# Patient Record
Sex: Female | Born: 1990 | Race: White | Hispanic: No | Smoking: Current every day smoker
Health system: Southern US, Community
[De-identification: ages and names within clinical notes are randomized; demographics above are authoritative.]

## PROBLEM LIST (undated history)

## (undated) DIAGNOSIS — R569 Unspecified convulsions: Secondary | ICD-10-CM

---
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
## 2014-01-08 ENCOUNTER — Emergency Department: Payer: Self-pay | Admitting: Emergency Medicine

## 2014-01-14 ENCOUNTER — Emergency Department: Payer: Self-pay | Admitting: Emergency Medicine

## 2014-03-21 ENCOUNTER — Emergency Department: Payer: Self-pay | Admitting: Emergency Medicine

## 2014-03-28 ENCOUNTER — Emergency Department: Payer: Self-pay | Admitting: Emergency Medicine

## 2014-07-04 ENCOUNTER — Ambulatory Visit: Payer: Self-pay

## 2014-07-06 ENCOUNTER — Emergency Department: Payer: Self-pay | Admitting: Emergency Medicine

## 2014-07-06 LAB — URINALYSIS, COMPLETE
BILIRUBIN, UR: NEGATIVE
Glucose,UR: NEGATIVE mg/dL (ref 0–75)
Hyaline Cast: 3
Ketone: NEGATIVE
NITRITE: NEGATIVE
PH: 8 (ref 4.5–8.0)
Protein: NEGATIVE
RBC,UR: 8 /HPF (ref 0–5)
SPECIFIC GRAVITY: 1.006 (ref 1.003–1.030)
WBC UR: 60 /HPF (ref 0–5)

## 2014-07-08 ENCOUNTER — Emergency Department: Payer: Self-pay | Admitting: Emergency Medicine

## 2014-07-08 LAB — URINALYSIS, COMPLETE
BLOOD: NEGATIVE
Bilirubin,UR: NEGATIVE
Glucose,UR: NEGATIVE mg/dL (ref 0–75)
Nitrite: NEGATIVE
PH: 5 (ref 4.5–8.0)
Protein: 30
Specific Gravity: 1.025 (ref 1.003–1.030)
WBC UR: 23 /HPF (ref 0–5)

## 2014-07-08 LAB — COMPREHENSIVE METABOLIC PANEL
ALK PHOS: 51 U/L
AST: 18 U/L (ref 15–37)
Albumin: 3.7 g/dL (ref 3.4–5.0)
Anion Gap: 7 (ref 7–16)
BILIRUBIN TOTAL: 0.5 mg/dL (ref 0.2–1.0)
BUN: 10 mg/dL (ref 7–18)
CREATININE: 1.02 mg/dL (ref 0.60–1.30)
Calcium, Total: 8.7 mg/dL (ref 8.5–10.1)
Chloride: 103 mmol/L (ref 98–107)
Co2: 24 mmol/L (ref 21–32)
EGFR (Non-African Amer.): >60
GLUCOSE: 135 mg/dL — AB (ref 65–99)
Osmolality: 269 (ref 275–301)
POTASSIUM: 3.6 mmol/L (ref 3.5–5.1)
SGPT (ALT): 20 U/L
Sodium: 134 mmol/L — ABNORMAL LOW (ref 136–145)
Total Protein: 7.4 g/dL (ref 6.4–8.2)

## 2014-07-08 LAB — CBC WITH DIFFERENTIAL/PLATELET
Basophil #: 0 10*3/uL (ref 0.0–0.1)
Basophil %: 0.2 %
Eosinophil #: 0 10*3/uL (ref 0.0–0.7)
Eosinophil %: 0.1 %
HCT: 38.3 % (ref 35.0–47.0)
HGB: 12.6 g/dL (ref 12.0–16.0)
LYMPHS PCT: 2.7 %
Lymphocyte #: 0.5 10*3/uL — ABNORMAL LOW (ref 1.0–3.6)
MCH: 29.9 pg (ref 26.0–34.0)
MCHC: 33 g/dL (ref 32.0–36.0)
MCV: 91 fL (ref 80–100)
MONOS PCT: 7.2 %
Monocyte #: 1.4 x10 3/mm — ABNORMAL HIGH (ref 0.2–0.9)
Neutrophil #: 17.7 10*3/uL — ABNORMAL HIGH (ref 1.4–6.5)
Neutrophil %: 89.8 %
Platelet: 196 10*3/uL (ref 150–440)
RBC: 4.22 10*6/uL (ref 3.80–5.20)
RDW: 12.1 % (ref 11.5–14.5)
WBC: 19.7 10*3/uL — ABNORMAL HIGH (ref 3.6–11.0)

## 2014-07-08 LAB — PREGNANCY, URINE: Pregnancy Test, Urine: NEGATIVE m[IU]/mL

## 2014-07-09 LAB — URINE CULTURE

## 2015-03-22 ENCOUNTER — Emergency Department
Admission: EM | Admit: 2015-03-22 | Discharge: 2015-03-22 | Disposition: A | Discharge status: Home / Self Care | Payer: Self-pay | Attending: Emergency Medicine | Admitting: Emergency Medicine

## 2015-03-22 ENCOUNTER — Emergency Department: Payer: Self-pay

## 2015-03-22 DIAGNOSIS — Z3202 Encounter for pregnancy test, result negative: Secondary | ICD-10-CM | POA: Insufficient documentation

## 2015-03-22 DIAGNOSIS — Z720 Tobacco use: Secondary | ICD-10-CM | POA: Insufficient documentation

## 2015-03-22 DIAGNOSIS — Y998 Other external cause status: Secondary | ICD-10-CM | POA: Insufficient documentation

## 2015-03-22 DIAGNOSIS — Y9289 Other specified places as the place of occurrence of the external cause: Secondary | ICD-10-CM | POA: Insufficient documentation

## 2015-03-22 DIAGNOSIS — Z880 Allergy status to penicillin: Secondary | ICD-10-CM | POA: Insufficient documentation

## 2015-03-22 DIAGNOSIS — S39012A Strain of muscle, fascia and tendon of lower back, initial encounter: Secondary | ICD-10-CM | POA: Insufficient documentation

## 2015-03-22 DIAGNOSIS — X58XXXA Exposure to other specified factors, initial encounter: Secondary | ICD-10-CM | POA: Insufficient documentation

## 2015-03-22 DIAGNOSIS — N390 Urinary tract infection, site not specified: Secondary | ICD-10-CM | POA: Insufficient documentation

## 2015-03-22 DIAGNOSIS — Y9389 Activity, other specified: Secondary | ICD-10-CM | POA: Insufficient documentation

## 2015-03-22 LAB — URINALYSIS COMPLETE WITH MICROSCOPIC (ARMC ONLY)
BILIRUBIN URINE: NEGATIVE
Glucose, UA: NEGATIVE mg/dL
Nitrite: NEGATIVE
PROTEIN: NEGATIVE mg/dL
Specific Gravity, Urine: 1.018 (ref 1.005–1.030)
pH: 5 (ref 5.0–8.0)

## 2015-03-22 LAB — POCT PREGNANCY, URINE: PREG TEST UR: NEGATIVE

## 2015-03-22 MED ORDER — KETOROLAC TROMETHAMINE 30 MG/ML IJ SOLN
INTRAMUSCULAR | Status: AC
  Administered 2015-03-22: 30 mg via INTRAMUSCULAR
  Filled 2015-03-22: qty 1

## 2015-03-22 MED ORDER — ORPHENADRINE CITRATE 30 MG/ML IJ SOLN
INTRAMUSCULAR | Status: AC
  Administered 2015-03-22: 30 mg via INTRAMUSCULAR
  Filled 2015-03-22: qty 2

## 2015-03-22 MED ORDER — KETOROLAC TROMETHAMINE 30 MG/ML IJ SOLN
30.0000 mg | Freq: Once | INTRAMUSCULAR | Status: AC
  Administered 2015-03-22: 30 mg via INTRAMUSCULAR

## 2015-03-22 MED ORDER — IBUPROFEN 200 MG PO TABS: 600.0000 mg | ORAL_TABLET | Freq: Three times a day (TID) | ORAL | Status: AC | PRN

## 2015-03-22 MED ORDER — ORPHENADRINE CITRATE 30 MG/ML IJ SOLN
30.0000 mg | Freq: Once | INTRAMUSCULAR | Status: AC
  Administered 2015-03-22: 30 mg via INTRAMUSCULAR

## 2015-03-22 MED ORDER — CYCLOBENZAPRINE HCL 5 MG PO TABS: 5.0000 mg | ORAL_TABLET | Freq: Three times a day (TID) | ORAL | Status: DC | PRN

## 2015-03-22 MED ORDER — NITROFURANTOIN MONOHYD MACRO 100 MG PO CAPS: 100.0000 mg | ORAL_CAPSULE | Freq: Two times a day (BID) | ORAL | Status: AC
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
## 2015-03-22 NOTE — Discharge Instructions (Signed)
Take medication as prescribed. Drink plenty of water. Rest. Stretch. Follow up with your primary care physician in 2-3 days. Return to ER for new or worsening concerns.  Low Back Sprain with Rehab  A sprain is an injury in which a ligament is torn. The ligaments of the lower back are vulnerable to sprains. However, they are strong and require great force to be injured. These ligaments are important for stabilizing the spinal column. Sprains are classified into three categories. Grade 1 sprains cause pain, but the tendon is not lengthened. Grade 2 sprains include a lengthened ligament, due to the ligament being stretched or partially ruptured. With grade 2 sprains there is still function, although the function may be decreased. Grade 3 sprains involve a complete tear of the tendon or muscle, and function is usually impaired. SYMPTOMS   Severe pain in the lower back.  Sometimes, a feeling of a "pop," "snap," or tear, at the time of injury.  Tenderness and sometimes swelling at the injury site.  Uncommonly, bruising (contusion) within 48 hours of injury.  Muscle spasms in the back. CAUSES  Low back sprains occur when a force is placed on the ligaments that is greater than they can handle. Common causes of injury include:  Performing a stressful act while off-balance.  Repetitive stressful activities that involve movement of the lower back.  Direct hit (trauma) to the lower back. RISK INCREASES WITH:  Contact sports (football, wrestling).  Collisions (major skiing accidents).  Sports that require throwing or lifting (baseball, weightlifting).  Sports involving twisting of the spine (gymnastics, diving, tennis, golf).  Poor strength and flexibility.  Inadequate protection.  Previous back injury or surgery (especially fusion). PREVENTION  Wear properly fitted and padded protective equipment.  Warm up and stretch properly before activity.  Allow for adequate recovery between  workouts.  Maintain physical fitness:  Strength, flexibility, and endurance.  Cardiovascular fitness.  Maintain a healthy body weight. PROGNOSIS  If treated properly, low back sprains usually heal with non-surgical treatment. The length of time for healing depends on the severity of the injury.  RELATED COMPLICATIONS   Recurring symptoms, resulting in a chronic problem.  Chronic inflammation and pain in the low back.  Delayed healing or resolution of symptoms, especially if activity is resumed too soon.  Prolonged impairment.  Unstable or arthritic joints of the low back. TREATMENT  Treatment first involves the use of ice and medicine, to reduce pain and inflammation. The use of strengthening and stretching exercises may help reduce pain with activity. These exercises may be performed at home or with a therapist. Severe injuries may require referral to a therapist for further evaluation and treatment, such as ultrasound. Your caregiver may advise that you wear a back brace or corset, to help reduce pain and discomfort. Often, prolonged bed rest results in greater harm then benefit. Corticosteroid injections may be recommended. However, these should be reserved for the most serious cases. It is important to avoid using your back when lifting objects. At night, sleep on your back on a firm mattress, with a pillow placed under your knees. If non-surgical treatment is unsuccessful, surgery may be needed.  MEDICATION   If pain medicine is needed, nonsteroidal anti-inflammatory medicines (aspirin and ibuprofen), or other minor pain relievers (acetaminophen), are often advised.  Do not take pain medicine for 7 days before surgery.  Prescription pain relievers may be given, if your caregiver thinks they are needed. Use only as directed and only as much as you  need.  Ointments applied to the skin may be helpful.  Corticosteroid injections may be given by your caregiver. These injections  should be reserved for the most serious cases, because they may only be given a certain number of times. HEAT AND COLD  Cold treatment (icing) should be applied for 10 to 15 minutes every 2 to 3 hours for inflammation and pain, and immediately after activity that aggravates your symptoms. Use ice packs or an ice massage.  Heat treatment may be used before performing stretching and strengthening activities prescribed by your caregiver, physical therapist, or athletic trainer. Use a heat pack or a warm water soak. SEEK MEDICAL CARE IF:   Symptoms get worse or do not improve in 2 to 4 weeks, despite treatment.  You develop numbness or weakness in either leg.  You lose bowel or bladder function.  Any of the following occur after surgery: fever, increased pain, swelling, redness, drainage of fluids, or bleeding in the affected area.  New, unexplained symptoms develop. (Drugs used in treatment may produce side effects.) EXERCISES  RANGE OF MOTION (ROM) AND STRETCHING EXERCISES - Low Back Sprain Most people with lower back pain will find that their symptoms get worse with excessive bending forward (flexion) or arching at the lower back (extension). The exercises that will help resolve your symptoms will focus on the opposite motion.  Your physician, physical therapist or athletic trainer will help you determine which exercises will be most helpful to resolve your lower back pain. Do not complete any exercises without first consulting with your caregiver. Discontinue any exercises which make your symptoms worse, until you speak to your caregiver. If you have pain, numbness or tingling which travels down into your buttocks, leg or foot, the goal of the therapy is for these symptoms to move closer to your back and eventually resolve. Sometimes, these leg symptoms will get better, but your lower back pain may worsen. This is often an indication of progress in your rehabilitation. Be very alert to any  changes in your symptoms and the activities in which you participated in the 24 hours prior to the change. Sharing this information with your caregiver will allow him or her to most efficiently treat your condition. These exercises may help you when beginning to rehabilitate your injury. Your symptoms may resolve with or without further involvement from your physician, physical therapist or athletic trainer. While completing these exercises, remember:   Restoring tissue flexibility helps normal motion to return to the joints. This allows healthier, less painful movement and activity.  An effective stretch should be held for at least 30 seconds.  A stretch should never be painful. You should only feel a gentle lengthening or release in the stretched tissue. FLEXION RANGE OF MOTION AND STRETCHING EXERCISES: STRETCH - Flexion, Single Knee to Chest   Lie on a firm bed or floor with both legs extended in front of you.  Keeping one leg in contact with the floor, bring your opposite knee to your chest. Hold your leg in place by either grabbing behind your thigh or at your knee.  Pull until you feel a gentle stretch in your low back. Hold __________ seconds.  Slowly release your grasp and repeat the exercise with the opposite side. Repeat __________ times. Complete this exercise __________ times per day.  STRETCH - Flexion, Double Knee to Chest  Lie on a firm bed or floor with both legs extended in front of you.  Keeping one leg in contact with the  floor, bring your opposite knee to your chest.  Tense your stomach muscles to support your back and then lift your other knee to your chest. Hold your legs in place by either grabbing behind your thighs or at your knees.  Pull both knees toward your chest until you feel a gentle stretch in your low back. Hold __________ seconds.  Tense your stomach muscles and slowly return one leg at a time to the floor. Repeat __________ times. Complete this  exercise __________ times per day.  STRETCH - Low Trunk Rotation  Lie on a firm bed or floor. Keeping your legs in front of you, bend your knees so they are both pointed toward the ceiling and your feet are flat on the floor.  Extend your arms out to the side. This will stabilize your upper body by keeping your shoulders in contact with the floor.  Gently and slowly drop both knees together to one side until you feel a gentle stretch in your low back. Hold for __________ seconds.  Tense your stomach muscles to support your lower back as you bring your knees back to the starting position. Repeat the exercise to the other side. Repeat __________ times. Complete this exercise __________ times per day  EXTENSION RANGE OF MOTION AND FLEXIBILITY EXERCISES: STRETCH - Extension, Prone on Elbows   Lie on your stomach on the floor, a bed will be too soft. Place your palms about shoulder width apart and at the height of your head.  Place your elbows under your shoulders. If this is too painful, stack pillows under your chest.  Allow your body to relax so that your hips drop lower and make contact more completely with the floor.  Hold this position for __________ seconds.  Slowly return to lying flat on the floor. Repeat __________ times. Complete this exercise __________ times per day.  RANGE OF MOTION - Extension, Prone Press Ups  Lie on your stomach on the floor, a bed will be too soft. Place your palms about shoulder width apart and at the height of your head.  Keeping your back as relaxed as possible, slowly straighten your elbows while keeping your hips on the floor. You may adjust the placement of your hands to maximize your comfort. As you gain motion, your hands will come more underneath your shoulders.  Hold this position __________ seconds.  Slowly return to lying flat on the floor. Repeat __________ times. Complete this exercise __________ times per day.  RANGE OF MOTION- Quadruped,  Neutral Spine   Assume a hands and knees position on a firm surface. Keep your hands under your shoulders and your knees under your hips. You may place padding under your knees for comfort.  Drop your head and point your tailbone toward the ground below you. This will round out your lower back like an angry cat. Hold this position for __________ seconds.  Slowly lift your head and release your tail bone so that your back sags into a large arch, like an old horse.  Hold this position for __________ seconds.  Repeat this until you feel limber in your low back.  Now, find your "sweet spot." This will be the most comfortable position somewhere between the two previous positions. This is your neutral spine. Once you have found this position, tense your stomach muscles to support your low back.  Hold this position for __________ seconds. Repeat __________ times. Complete this exercise __________ times per day.  STRENGTHENING EXERCISES - Low Back Sprain These  exercises may help you when beginning to rehabilitate your injury. These exercises should be done near your "sweet spot." This is the neutral, low-back arch, somewhere between fully rounded and fully arched, that is your least painful position. When performed in this safe range of motion, these exercises can be used for people who have either a flexion or extension based injury. These exercises may resolve your symptoms with or without further involvement from your physician, physical therapist or athletic trainer. While completing these exercises, remember:   Muscles can gain both the endurance and the strength needed for everyday activities through controlled exercises.  Complete these exercises as instructed by your physician, physical therapist or athletic trainer. Increase the resistance and repetitions only as guided.  You may experience muscle soreness or fatigue, but the pain or discomfort you are trying to eliminate should never worsen  during these exercises. If this pain does worsen, stop and make certain you are following the directions exactly. If the pain is still present after adjustments, discontinue the exercise until you can discuss the trouble with your caregiver. STRENGTHENING - Deep Abdominals, Pelvic Tilt   Lie on a firm bed or floor. Keeping your legs in front of you, bend your knees so they are both pointed toward the ceiling and your feet are flat on the floor.  Tense your lower abdominal muscles to press your low back into the floor. This motion will rotate your pelvis so that your tail bone is scooping upwards rather than pointing at your feet or into the floor. With a gentle tension and even breathing, hold this position for __________ seconds. Repeat __________ times. Complete this exercise __________ times per day.  STRENGTHENING - Abdominals, Crunches   Lie on a firm bed or floor. Keeping your legs in front of you, bend your knees so they are both pointed toward the ceiling and your feet are flat on the floor. Cross your arms over your chest.  Slightly tip your chin down without bending your neck.  Tense your abdominals and slowly lift your trunk high enough to just clear your shoulder blades. Lifting higher can put excessive stress on the lower back and does not further strengthen your abdominal muscles.  Control your return to the starting position. Repeat __________ times. Complete this exercise __________ times per day.  STRENGTHENING - Quadruped, Opposite UE/LE Lift   Assume a hands and knees position on a firm surface. Keep your hands under your shoulders and your knees under your hips. You may place padding under your knees for comfort.  Find your neutral spine and gently tense your abdominal muscles so that you can maintain this position. Your shoulders and hips should form a rectangle that is parallel with the floor and is not twisted.  Keeping your trunk steady, lift your right hand no higher  than your shoulder and then your left leg no higher than your hip. Make sure you are not holding your breath. Hold this position for __________ seconds.  Continuing to keep your abdominal muscles tense and your back steady, slowly return to your starting position. Repeat with the opposite arm and leg. Repeat __________ times. Complete this exercise __________ times per day.  STRENGTHENING - Abdominals and Quadriceps, Straight Leg Raise   Lie on a firm bed or floor with both legs extended in front of you.  Keeping one leg in contact with the floor, bend the other knee so that your foot can rest flat on the floor.  Find your neutral  spine, and tense your abdominal muscles to maintain your spinal position throughout the exercise.  Slowly lift your straight leg off the floor about 6 inches for a count of 15, making sure to not hold your breath.  Still keeping your neutral spine, slowly lower your leg all the way to the floor. Repeat this exercise with each leg __________ times. Complete this exercise __________ times per day. POSTURE AND BODY MECHANICS CONSIDERATIONS - Low Back Sprain Keeping correct posture when sitting, standing or completing your activities will reduce the stress put on different body tissues, allowing injured tissues a chance to heal and limiting painful experiences. The following are general guidelines for improved posture. Your physician or physical therapist will provide you with any instructions specific to your needs. While reading these guidelines, remember:  The exercises prescribed by your provider will help you have the flexibility and strength to maintain correct postures.  The correct posture provides the best environment for your joints to work. All of your joints have less wear and tear when properly supported by a spine with good posture. This means you will experience a healthier, less painful body.  Correct posture must be practiced with all of your activities,  especially prolonged sitting and standing. Correct posture is as important when doing repetitive low-stress activities (typing) as it is when doing a single heavy-load activity (lifting). RESTING POSITIONS Consider which positions are most painful for you when choosing a resting position. If you have pain with flexion-based activities (sitting, bending, stooping, squatting), choose a position that allows you to rest in a less flexed posture. You would want to avoid curling into a fetal position on your side. If your pain worsens with extension-based activities (prolonged standing, working overhead), avoid resting in an extended position such as sleeping on your stomach. Most people will find more comfort when they rest with their spine in a more neutral position, neither too rounded nor too arched. Lying on a non-sagging bed on your side with a pillow between your knees, or on your back with a pillow under your knees will often provide some relief. Keep in mind, being in any one position for a prolonged period of time, no matter how correct your posture, can still lead to stiffness. PROPER SITTING POSTURE In order to minimize stress and discomfort on your spine, you must sit with correct posture. Sitting with good posture should be effortless for a healthy body. Returning to good posture is a gradual process. Many people can work toward this most comfortably by using various supports until they have the flexibility and strength to maintain this posture on their own. When sitting with proper posture, your ears will fall over your shoulders and your shoulders will fall over your hips. You should use the back of the chair to support your upper back. Your lower back will be in a neutral position, just slightly arched. You may place a small pillow or folded towel at the base of your lower back for  support.  When working at a desk, create an environment that supports good, upright posture. Without extra support,  muscles tire, which leads to excessive strain on joints and other tissues. Keep these recommendations in mind: CHAIR:  A chair should be able to slide under your desk when your back makes contact with the back of the chair. This allows you to work closely.  The chair's height should allow your eyes to be level with the upper part of your monitor and your hands to  be slightly lower than your elbows. BODY POSITION  Your feet should make contact with the floor. If this is not possible, use a foot rest.  Keep your ears over your shoulders. This will reduce stress on your neck and low back. INCORRECT SITTING POSTURES  If you are feeling tired and unable to assume a healthy sitting posture, do not slouch or slump. This puts excessive strain on your back tissues, causing more damage and pain. Healthier options include:  Using more support, like a lumbar pillow.  Switching tasks to something that requires you to be upright or walking.  Talking a brief walk.  Lying down to rest in a neutral-spine position. PROLONGED STANDING WHILE SLIGHTLY LEANING FORWARD  When completing a task that requires you to lean forward while standing in one place for a long time, place either foot up on a stationary 2-4 inch high object to help maintain the best posture. When both feet are on the ground, the lower back tends to lose its slight inward curve. If this curve flattens (or becomes too large), then the back and your other joints will experience too much stress, tire more quickly, and can cause pain. CORRECT STANDING POSTURES Proper standing posture should be assumed with all daily activities, even if they only take a few moments, like when brushing your teeth. As in sitting, your ears should fall over your shoulders and your shoulders should fall over your hips. You should keep a slight tension in your abdominal muscles to brace your spine. Your tailbone should point down to the ground, not behind your body,  resulting in an over-extended swayback posture.  INCORRECT STANDING POSTURES  Common incorrect standing postures include a forward head, locked knees and/or an excessive swayback. WALKING Walk with an upright posture. Your ears, shoulders and hips should all line-up. PROLONGED ACTIVITY IN A FLEXED POSITION When completing a task that requires you to bend forward at your waist or lean over a low surface, try to find a way to stabilize 3 out of 4 of your limbs. You can place a hand or elbow on your thigh or rest a knee on the surface you are reaching across. This will provide you more stability, so that your muscles do not tire as quickly. By keeping your knees relaxed, or slightly bent, you will also reduce stress across your lower back. CORRECT LIFTING TECHNIQUES DO :  Assume a wide stance. This will provide you more stability and the opportunity to get as close as possible to the object which you are lifting.  Tense your abdominals to brace your spine. Bend at the knees and hips. Keeping your back locked in a neutral-spine position, lift using your leg muscles. Lift with your legs, keeping your back straight.  Test the weight of unknown objects before attempting to lift them.  Try to keep your elbows locked down at your sides in order get the best strength from your shoulders when carrying an object.  Always ask for help when lifting heavy or awkward objects. INCORRECT LIFTING TECHNIQUES DO NOT:   Lock your knees when lifting, even if it is a small object.  Bend and twist. Pivot at your feet or move your feet when needing to change directions.  Assume that you can safely pick up even a paperclip without proper posture. Document Released: 11/04/2005 Document Revised: 01/27/2012 Document Reviewed: 02/16/2009 [facility] Patient Information 2015 [place], [place]. This information is not intended to replace advice given to you by your health care provider.  Make sure you discuss any questions you  have with your health care provider.  Urinary Tract Infection Urinary tract infections (UTIs) can develop anywhere along your urinary tract. Your urinary tract is your body's drainage system for removing wastes and extra water. Your urinary tract includes two kidneys, two ureters, a bladder, and a urethra. Your kidneys are a pair of bean-shaped organs. Each kidney is about the size of your fist. They are located below your ribs, one on each side of your spine. CAUSES Infections are caused by microbes, which are microscopic organisms, including fungi, viruses, and bacteria. These organisms are so small that they can only be seen through a microscope. Bacteria are the microbes that most commonly cause UTIs. SYMPTOMS  Symptoms of UTIs may vary by age and gender of the patient and by the location of the infection. Symptoms in young women typically include a frequent and intense urge to urinate and a painful, burning feeling in the bladder or urethra during urination. Older women and men are more likely to be tired, shaky, and weak and have muscle aches and abdominal pain. A fever may mean the infection is in your kidneys. Other symptoms of a kidney infection include pain in your back or sides below the ribs, nausea, and vomiting. DIAGNOSIS To diagnose a UTI, your caregiver will ask you about your symptoms. Your caregiver also will ask to provide a urine sample. The urine sample will be tested for bacteria and white blood cells. White blood cells are made by your body to help fight infection. TREATMENT  Typically, UTIs can be treated with medication. Because most UTIs are caused by a bacterial infection, they usually can be treated with the use of antibiotics. The choice of antibiotic and length of treatment depend on your symptoms and the type of bacteria causing your infection. HOME CARE INSTRUCTIONS  If you were prescribed antibiotics, take them exactly as your caregiver instructs you. Finish the  medication even if you feel better after you have only taken some of the medication.  Drink enough water and fluids to keep your urine clear or pale yellow.  Avoid caffeine, tea, and carbonated beverages. They tend to irritate your bladder.  Empty your bladder often. Avoid holding urine for long periods of time.  Empty your bladder before and after sexual intercourse.  After a bowel movement, women should cleanse from front to back. Use each tissue only once. SEEK MEDICAL CARE IF:   You have back pain.  You develop a fever.  Your symptoms do not begin to resolve within 3 days. SEEK IMMEDIATE MEDICAL CARE IF:   You have severe back pain or lower abdominal pain.  You develop chills.  You have nausea or vomiting.  You have continued burning or discomfort with urination. MAKE SURE YOU:   Understand these instructions.  Will watch your condition.  Will get help right away if you are not doing well or get worse. Document Released: 08/14/2005 Document Revised: 05/05/2012 Document Reviewed: 12/13/2011 [facility] Patient Information 2015 [place], [place]. This information is not intended to replace advice given to you by your health care provider. Make sure you discuss any questions you have with your health care provider.

## 2015-03-22 NOTE — ED Notes (Signed)
This RN called to lab to obtain update on urine sample. Lab tech reported urine is still running at this time.

## 2015-03-25 LAB — URINE CULTURE
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
## 2015-07-06 ENCOUNTER — Encounter: Payer: Self-pay | Admitting: Emergency Medicine

## 2015-07-06 ENCOUNTER — Ambulatory Visit
Admission: EM | Admit: 2015-07-06 | Discharge: 2015-07-06 | Disposition: A | Discharge status: Home / Self Care | Payer: Self-pay | Attending: Emergency Medicine | Admitting: Emergency Medicine

## 2015-07-06 DIAGNOSIS — R059 Cough, unspecified: Secondary | ICD-10-CM

## 2015-07-06 DIAGNOSIS — F17210 Nicotine dependence, cigarettes, uncomplicated: Secondary | ICD-10-CM | POA: Insufficient documentation

## 2015-07-06 DIAGNOSIS — J0110 Acute frontal sinusitis, unspecified: Secondary | ICD-10-CM

## 2015-07-06 DIAGNOSIS — J019 Acute sinusitis, unspecified: Secondary | ICD-10-CM | POA: Insufficient documentation

## 2015-07-06 DIAGNOSIS — R05 Cough: Secondary | ICD-10-CM | POA: Insufficient documentation

## 2015-07-06 LAB — RAPID STREP SCREEN (MED CTR MEBANE ONLY): STREPTOCOCCUS, GROUP A SCREEN (DIRECT): NEGATIVE

## 2015-07-06 MED ORDER — AZITHROMYCIN 250 MG PO TABS: 250.0000 mg | ORAL_TABLET | Freq: Every day | ORAL | Status: DC

## 2015-07-06 MED ORDER — BENZONATATE 100 MG PO CAPS: 100.0000 mg | ORAL_CAPSULE | Freq: Three times a day (TID) | ORAL | Status: DC | PRN

## 2015-07-06 MED ORDER — IPRATROPIUM-ALBUTEROL 0.5-2.5 (3) MG/3ML IN SOLN
3.0000 mL | Freq: Once | RESPIRATORY_TRACT | Status: AC
  Administered 2015-07-06: 3 mL via RESPIRATORY_TRACT

## 2015-07-06 NOTE — ED Notes (Signed)
Pt with acough and head congestion x 2 weeks

## 2015-07-06 NOTE — ED Provider Notes (Signed)
CSN: [id number]     Arrival date & time 07/06/15  1346 History   First MD Initiated Contact with Patient 07/06/15 1427     Chief Complaint  Patient presents with  . Cough   (Consider location/radiation/quality/duration/timing/severity/associated sxs/prior Treatment) HPI  24 yo F reports 2 week hx of fever, cough, sinus congestion, sore throat , decreased appetite. Last week facial pain and tooth pain ,now improved. Denies previous diagnosis of seasonal allergies but symptoms prior to the acute are compatible with same- Does not own a thermometer but is sure she has had intermittent fever Has tried multiple OTC medications- various mixtures .Continues to smoke. Continues to cough. Has eustachian tube dysfunction , non-productive cough History reviewed. No pertinent past medical history. History reviewed. No pertinent past surgical history. History reviewed. No pertinent family history. Social History  Substance Use Topics  . Smoking status: Current Every Day Smoker -- 0.50 packs/day    Types: Cigarettes  . Smokeless tobacco: None  . Alcohol Use: None   OB History    No data available     Review of Systems Constitutional -afebrile Eyes-denies visual changes ENT- normal voice,positive sore throat CV-denies chest pain Resp-denies SOB, has frequent cough GI- negative for nausea,vomiting ( x 1 w cough), diarrhea GU- negative for dysuria MSK- negative for back pain, ambulatory Skin- denies acute changes Neuro- negative headache,focal weakness or numbness     Allergies  Penicillins  Home Medications   Prior to Admission medications   Medication Sig Start Date End Date Taking? Authorizing Provider  azithromycin (ZITHROMAX) 250 MG tablet Take 1 tablet (250 mg total) by mouth daily. Take first 2 tablets together, then 1 every day until finished. 07/06/15   Rae Halsted, PA-C  benzonatate (TESSALON) 100 MG capsule Take 1 capsule (100 mg total) by mouth 3 (three) times daily as  needed. 07/06/15   Rae Halsted, PA-C  cyclobenzaprine (FLEXERIL) 5 MG tablet Take 1 tablet (5 mg total) by mouth every 8 (eight) hours as needed for muscle spasms (or pain. Do not drive or operate machinery while taking as can cause drowsiness.). 03/22/15   Renford Dills, NP  ibuprofen (MOTRIN IB) 200 MG tablet Take 3 tablets (600 mg total) by mouth every 8 (eight) hours as needed for mild pain or moderate pain. 03/22/15 03/21/16  Renford Dills, NP   BP 103/67 mmHg  Pulse 85  Temp(Src) 98.5 F (36.9 C) (Tympanic)  Resp 18  Ht  (1.676 m)  Wt 110 lb (49.896 kg)  BMI 17.76 kg/m2  SpO2 100%  LMP 06/07/2015 Physical Exam  Constitutional -alert and oriented , afebrile Head-atraumatic, normocephalic, percussive pain over frontal and maxillary sinuses Eyes- conjunctiva normal, EOMI ,conjugate gaze Ears- canals and TM neg Nose- no congestion or rhinorrhea Mouth/throat- mucous membranes moist ,oropharynx erythematous, strep neg Neck- supple without glandular enlargement CV- regular rate, grossly normal heart sounds,  Resp-no distress, normal respiratory effort,clear to auscultation bilaterally, repeated cough,non-productive GI- soft,non-tender,no distention GU-deferred MSK- no tender, normal ROM, all extremities, ambulatory, self-care Neuro- normal speech and language, no gross focal neurological deficit appreciated, no gait instability, Skin-warm,dry ,intact; no rash noted Psych-mood and affect grossly normal; speech and behavior grossly normal ED Course  Procedures (including critical care time) Labs Review Labs Reviewed  RAPID STREP SCREEN (NOT AT [facility])  CULTURE, GROUP A STREP ([facility] ONLY)   Results for orders placed or performed during the hospital encounter of 07/06/15  Rapid strep screen  Result Value Ref Range   Streptococcus,  Group A Screen (Direct) NEGATIVE NEGATIVE  Culture, group A strep ([facility] only)  Result Value Ref Range   Specimen Description THROAT    Special Requests  NONE    Culture NO BETA STREPTOCOCCUS ISOLATED IN 12 HOURS    Report Status PENDING    Imaging Review No results found.  Medications  ipratropium-albuterol (DUONEB) 0.5-2.5 (3) MG/3ML nebulizer solution 3 mL (3 mLs Nebulization Given 07/06/15 1445)  well tolerated - chest loosened  Now remembers that she has an inhaler at home . Recently left her mothers home and living with girlfriend. Never realized she could use inhaler for her cough MDM   Cough Acute sinusitis  Plan: 1.  Diagnosis reviewed with patient-seasonal allergies suspected as influential in acute sinusitis 2. Rx Benzonatate, fluticasone ; risks, benefits, potential side effects reviewed with patient 3. Recommend supportive treatment with OTC ceterizine/guafenesin DM/tylenol/ibuprofen/fluids 4. F/u prn if symptoms worsen or don't improve; viral syndrome can be similar 5. OOW today 6. Strongly encouraged to establish PCP care- her father died and she lost her insurance. Mother doesn't have any either, Encouraged [facility] and learn about [facility]   Discharge Medication List as of 07/06/2015  3:17 PM    START taking these medications   Details  azithromycin (ZITHROMAX) 250 MG tablet Take 1 tablet (250 mg total) by mouth daily. Take first 2 tablets together, then 1 every day until finished., Starting 07/06/2015, Until Discontinued, Print    benzonatate (TESSALON) 100 MG capsule Take 1 capsule (100 mg total) by mouth 3 (three) times daily as needed., Starting 07/06/2015, Until Discontinued, Print         Rae Halsted, PA-C 07/07/15 2329

## 2015-07-06 NOTE — Discharge Instructions (Signed)
Azithromycin 2 tablets  today then 1 a day for 4 more days  Benzonatate  Cough pill 1 -2 every 6 hours as needed for cough  Robitussin DM ( guaifenasen DM) 1 tsp every 4 hours  Fluticasone nasal spray ( Flonase)  1 spray per nostril per day ( first 2-3 days may do twice a day)  Claritin 10 mg ( Loratadine ) 1 tablet daily  Until hard freeze ( ok twice daily for a few days)  Try NOT to smoke !  Continue allergy meds _ Claritin/Loratadine  And the fluticasone / Flonase daily until hard freeze  LED thermometer "FAST" and audible

## 2015-07-09 LAB — CULTURE, GROUP A STREP (THRC)

## 2016-06-05 ENCOUNTER — Encounter: Payer: Self-pay | Admitting: Emergency Medicine

## 2016-06-05 DIAGNOSIS — R55 Syncope and collapse: Secondary | ICD-10-CM | POA: Insufficient documentation

## 2016-06-05 DIAGNOSIS — L918 Other hypertrophic disorders of the skin: Secondary | ICD-10-CM | POA: Insufficient documentation

## 2016-06-05 DIAGNOSIS — F17210 Nicotine dependence, cigarettes, uncomplicated: Secondary | ICD-10-CM | POA: Insufficient documentation

## 2016-06-05 LAB — URINALYSIS COMPLETE WITH MICROSCOPIC (ARMC ONLY)
BILIRUBIN URINE: NEGATIVE
GLUCOSE, UA: NEGATIVE mg/dL
Nitrite: NEGATIVE
PH: 7 (ref 5.0–8.0)
Protein, ur: NEGATIVE mg/dL
Specific Gravity, Urine: 1.019 (ref 1.005–1.030)

## 2016-06-05 LAB — CBC
HCT: 42.5 % (ref 35.0–47.0)
Hemoglobin: 14.9 g/dL (ref 12.0–16.0)
MCH: 31 pg (ref 26.0–34.0)
MCHC: 35.1 g/dL (ref 32.0–36.0)
MCV: 88.1 fL (ref 80.0–100.0)
PLATELETS: 212 10*3/uL (ref 150–440)
RBC: 4.82 MIL/uL (ref 3.80–5.20)
RDW: 12.6 % (ref 11.5–14.5)
WBC: 7.7 10*3/uL (ref 3.6–11.0)

## 2016-06-05 LAB — BASIC METABOLIC PANEL
Anion gap: 10 (ref 5–15)
BUN: 15 mg/dL (ref 6–20)
CALCIUM: 9.6 mg/dL (ref 8.9–10.3)
CHLORIDE: 107 mmol/L (ref 101–111)
CO2: 23 mmol/L (ref 22–32)
CREATININE: 0.86 mg/dL (ref 0.44–1.00)
GFR calc Af Amer: >60 mL/min (ref >60)
GFR calc non Af Amer: >60 mL/min (ref >60)
Glucose, Bld: 122 mg/dL — ABNORMAL HIGH (ref 65–99)
Potassium: 3.1 mmol/L — ABNORMAL LOW (ref 3.5–5.1)
SODIUM: 140 mmol/L (ref 135–145)

## 2016-06-05 LAB — POCT PREGNANCY, URINE: Preg Test, Ur: NEGATIVE

## 2016-06-05 MED ORDER — ONDANSETRON 4 MG PO TBDP
ORAL_TABLET | ORAL | Status: AC
  Filled 2016-06-05: qty 1

## 2016-06-05 MED ORDER — ONDANSETRON 4 MG PO TBDP
4.0000 mg | ORAL_TABLET | Freq: Once | ORAL | Status: AC | PRN
  Administered 2016-06-05: 4 mg via ORAL

## 2016-06-05 NOTE — ED Notes (Addendum)
Pt presents to ED with c/o near syncopal episode, pt states last night she noticed mole on her lower back with bleeding. Pt states "I was siting on the bench and I turned really quick and it began to bleed." Pt reports then began to feel dizzy and nauseous. Pt alert and oriented x 4, no increased work in breathing noted. No bleeding noticed.

## 2016-06-06 ENCOUNTER — Emergency Department
Admission: EM | Admit: 2016-06-06 | Discharge: 2016-06-06 | Disposition: A | Discharge status: Home / Self Care | Payer: Self-pay | Attending: Emergency Medicine | Admitting: Emergency Medicine

## 2016-06-06 DIAGNOSIS — R55 Syncope and collapse: Secondary | ICD-10-CM

## 2016-06-06 DIAGNOSIS — L918 Other hypertrophic disorders of the skin: Secondary | ICD-10-CM

## 2016-06-06 MED ORDER — IBUPROFEN 400 MG PO TABS
400.0000 mg | ORAL_TABLET | Freq: Once | ORAL | Status: AC
  Administered 2016-06-06: 400 mg via ORAL
  Filled 2016-06-06: qty 1

## 2016-06-06 NOTE — ED Notes (Signed)
Pt left without being discharged or discharge paperwork being reviewed

## 2016-06-06 NOTE — ED Provider Notes (Signed)
[facility] Emergency Department Provider Note   ____________________________________________  Time seen: Approximately 12:56 AM  I have reviewed the triage vital signs and the nursing notes.   HISTORY  Chief Complaint Near Syncope   HPI Patricia Guerra is a 25 y.o. female without any chronic medical conditions was presenting to the emergency department today for near-syncopal episode. She said that she was sitting on a bench and turned her body and almost ripped off a mole on her back. She says the mole is been there for 6 months but is recently started to become a nuisance because she has had several bleeding episodes from the mole lately. She says that the pain was so excruciating when she injured the mole today that her vision went black and she lost hearing for several seconds. She denies any chest pain or shortness of breath. Says that she felt panicked but has returned to normal since this event. Says that she was having back pain around the mole that was injured and this is persistent.   History reviewed. No pertinent past medical history.  There are no active problems to display for this patient.   History reviewed. No pertinent past surgical history.  Current Outpatient Rx  Name  Route  Sig  Dispense  Refill  . azithromycin (ZITHROMAX) 250 MG tablet   Oral   Take 1 tablet (250 mg total) by mouth daily. Take first 2 tablets together, then 1 every day until finished. Patient not taking: Reported on 06/05/2016   6 tablet   0   . benzonatate (TESSALON) 100 MG capsule   Oral   Take 1 capsule (100 mg total) by mouth 3 (three) times daily as needed. Patient not taking: Reported on 06/05/2016   30 capsule   0   . cyclobenzaprine (FLEXERIL) 5 MG tablet   Oral   Take 1 tablet (5 mg total) by mouth every 8 (eight) hours as needed for muscle spasms (or pain. Do not drive or operate machinery while taking as can cause drowsiness.). Patient not  taking: Reported on 06/05/2016   12 tablet   0     Allergies Penicillins  No family history on file.  Social History Social History  Substance Use Topics  . Smoking status: Current Every Day Smoker -- 0.50 packs/day    Types: Cigarettes  . Smokeless tobacco: None  . Alcohol Use: No    Review of Systems Constitutional: No fever/chills Eyes: As above ENT: No sore throat. Cardiovascular: Denies chest pain. Respiratory: Denies shortness of breath. Gastrointestinal: No abdominal pain.  No nausea, no vomiting.  No diarrhea.  No constipation. Genitourinary: Negative for dysuria. Musculoskeletal: Around site of the mole as above. Skin: Negative for rash. Neurological: Negative for headaches, focal weakness or numbness.  10-point ROS otherwise negative.  ____________________________________________   PHYSICAL EXAM:  VITAL SIGNS: ED Triage Vitals  Enc Vitals Group     BP 06/05/16 2211 118/90 mmHg     Pulse Rate 06/05/16 2211 76     Resp 06/05/16 2211 20     Temp 06/05/16 2211 97.4 F (36.3 C)     Temp Source 06/05/16 2211 Oral     SpO2 06/05/16 2211 100 %     Weight 06/05/16 2211 102 lb (46.267 kg)     Height 06/05/16 2211 5\' 6"  (1.676 m)     Head Cir --      Peak Flow --      Pain Score 06/05/16 2212 10  Pain Loc --      Pain Edu? --      Excl. in GC? --     Constitutional: Alert and oriented. Well appearing and in no acute distress. Eyes: Conjunctivae are normal. PERRL. EOMI. Head: Atraumatic. Nose: No congestion/rhinnorhea. Mouth/Throat: Mucous membranes are moist.   Neck: No stridor.   Cardiovascular: Normal rate, regular rhythm. Grossly normal heart sounds.  Good peripheral circulation With intact in bilateral radial as well as dorsalis pedis pulses. Respiratory: Normal respiratory effort.  No retractions. Lungs CTAB. Gastrointestinal: Soft and nontender. No distention.  No CVA tenderness. Musculoskeletal: No lower extremity tenderness nor edema.  No  joint effusions. Neurologic:  Normal speech and language. No gross focal neurologic deficits are appreciated. No gait instability. Skin:  Skin is warm, dry and intact. No rash noted.  Half a centimeter skin tag just right of the midline to the lumbar region without any active bleeding. Mild tenderness over the site of this skin tag. No discoloration. It is not hard/fixed. Psychiatric: Mood and affect are normal. Speech and behavior are normal.  ____________________________________________   LABS (all labs ordered are listed, but only abnormal results are displayed)  Labs Reviewed  BASIC METABOLIC PANEL - Abnormal; Notable for the following:    Potassium 3.1 (*)    Glucose, Bld 122 (*)    All other components within normal limits  URINALYSIS COMPLETEWITH MICROSCOPIC ([facility] ONLY) - Abnormal; Notable for the following:    Color, Urine YELLOW (*)    APPearance CLEAR (*)    Ketones, ur TRACE (*)    Hgb urine dipstick 1+ (*)    Leukocytes, UA 1+ (*)    Bacteria, UA RARE (*)    Squamous Epithelial / LPF 0-5 (*)    All other components within normal limits  CBC  CBG MONITORING, ED  POCT PREGNANCY, URINE   ____________________________________________  EKG  ED ECG REPORT I, Jeanny Rymer,  Teena Irani, the attending physician, personally viewed and interpreted this ECG.   Date: 06/06/2016  EKG Time: 2221  Rate: 76  Rhythm: normal sinus rhythm  Axis: Normal  Intervals:Short PR  ST&T Change: No ST segment elevation or depression. t-wave inversion in lead V2 but likely secondary to lead placement.  ____________________________________________  RADIOLOGY   ____________________________________________   PROCEDURES   Procedures   ____________________________________________   INITIAL IMPRESSION / ASSESSMENT AND PLAN / ED COURSE  Pertinent labs & imaging results that were available during my care of the patient were reviewed by me and considered in my medical decision making (see  chart for details).  PERC negative. The patient denies being on any birth control hormone supplements. Patient likely with near-syncopal episode secondary to pain response from injuries to a skin tag on her back. Unfortunately, she does not have insurance and will have some difficulty following up with the dermatologist. I'll be referring her to the [facility] for possible further referral to a dermatologist. She understands the plan and is willing to comply. Pulses intact throughout. Very unlikely to be aortic catastrophe. Very young for ischemic heart disease. Likely pain response resulting in near syncopal episode. ____________________________________________   FINAL CLINICAL IMPRESSION(S) / ED DIAGNOSES  Skin tag. Near-syncope.    NEW MEDICATIONS STARTED DURING THIS VISIT:  New Prescriptions   No medications on file     Note:  This document was prepared using Dragon voice recognition software and may include unintentional dictation errors.    Myrna Blazer, MD 06/06/16 [phone]

## 2016-06-06 NOTE — Discharge Instructions (Signed)

## 2016-06-06 NOTE — ED Notes (Signed)
Pt reports that she has a mole on her back that began to bleed yesterday (it has been there for about 6 months) - Pt reports that she "almost blacked out" because of the pain that occurred when her mole began to bleed - She also had cramps over her entire body on the way to the ER

## 2016-06-07 LAB — URINE CULTURE: CULTURE: NO GROWTH

## 2017-01-12 DIAGNOSIS — G40909 Epilepsy, unspecified, not intractable, without status epilepticus: Secondary | ICD-10-CM

## 2017-04-08 DIAGNOSIS — F640 Transsexualism: Secondary | ICD-10-CM | POA: Diagnosis present

## 2017-06-19 ENCOUNTER — Emergency Department
Admission: EM | Admit: 2017-06-19 | Discharge: 2017-06-19 | Disposition: A | Discharge status: Home / Self Care | Payer: Self-pay | Attending: Emergency Medicine | Admitting: Emergency Medicine

## 2017-06-19 ENCOUNTER — Encounter: Payer: Self-pay | Admitting: Emergency Medicine

## 2017-06-19 DIAGNOSIS — R569 Unspecified convulsions: Secondary | ICD-10-CM | POA: Insufficient documentation

## 2017-06-19 DIAGNOSIS — F17210 Nicotine dependence, cigarettes, uncomplicated: Secondary | ICD-10-CM | POA: Insufficient documentation

## 2017-06-19 DIAGNOSIS — Z79899 Other long term (current) drug therapy: Secondary | ICD-10-CM | POA: Insufficient documentation

## 2017-06-19 DIAGNOSIS — F649 Gender identity disorder, unspecified: Secondary | ICD-10-CM | POA: Insufficient documentation

## 2017-06-19 HISTORY — DX: Unspecified convulsions: R56.9

## 2017-06-19 LAB — CBC
HCT: 41.8 % (ref 35.0–47.0)
HEMOGLOBIN: 14.1 g/dL (ref 12.0–16.0)
MCH: 30.4 pg (ref 26.0–34.0)
MCHC: 33.7 g/dL (ref 32.0–36.0)
MCV: 90.2 fL (ref 80.0–100.0)
PLATELETS: 215 10*3/uL (ref 150–440)
RBC: 4.64 MIL/uL (ref 3.80–5.20)
RDW: 13.3 % (ref 11.5–14.5)
WBC: 10.7 10*3/uL (ref 3.6–11.0)

## 2017-06-19 LAB — BASIC METABOLIC PANEL
ANION GAP: 16 — AB (ref 5–15)
BUN: 15 mg/dL (ref 6–20)
CO2: 18 mmol/L — AB (ref 22–32)
Calcium: 9.3 mg/dL (ref 8.9–10.3)
Chloride: 104 mmol/L (ref 101–111)
Creatinine, Ser: 1.13 mg/dL — ABNORMAL HIGH (ref 0.44–1.00)
GFR calc Af Amer: >60 mL/min (ref >60)
GLUCOSE: 65 mg/dL (ref 65–99)
Potassium: 3.6 mmol/L (ref 3.5–5.1)
Sodium: 138 mmol/L (ref 135–145)

## 2017-06-19 LAB — HCG, QUANTITATIVE, PREGNANCY: hCG, Beta Chain, Quant, S: <1 m[IU]/mL (ref <5)

## 2017-06-19 MED ORDER — LORAZEPAM 2 MG/ML IJ SOLN
2.0000 mg | Freq: Once | INTRAMUSCULAR | Status: AC
  Administered 2017-06-19: 2 mg via INTRAVENOUS

## 2017-06-19 MED ORDER — SODIUM CHLORIDE 0.9 % IV BOLUS (SEPSIS)
1000.0000 mL | Freq: Once | INTRAVENOUS | Status: AC
  Administered 2017-06-19: 1000 mL via INTRAVENOUS

## 2017-06-19 MED ORDER — LEVETIRACETAM 500 MG PO TABS: ORAL_TABLET | ORAL | 0 refills | Status: DC

## 2017-06-19 MED ORDER — SODIUM CHLORIDE 0.9 % IV SOLN: 1000.0000 mg | Freq: Once | INTRAVENOUS | Status: DC

## 2017-06-19 MED ORDER — SODIUM CHLORIDE 0.9 % IV SOLN
1000.0000 mg | Freq: Once | INTRAVENOUS | Status: AC
  Administered 2017-06-19: 1000 mg via INTRAVENOUS
  Filled 2017-06-19: qty 10

## 2017-06-19 MED ORDER — LAMOTRIGINE 25 MG PO TABS
25.0000 mg | ORAL_TABLET | Freq: Once | ORAL | Status: AC
  Administered 2017-06-19: 25 mg via ORAL
  Filled 2017-06-19: qty 1

## 2017-06-19 MED ORDER — LORAZEPAM 2 MG/ML IJ SOLN
INTRAMUSCULAR | Status: AC
  Administered 2017-06-19: 2 mg via INTRAVENOUS
  Filled 2017-06-19: qty 1

## 2017-06-19 NOTE — ED Notes (Signed)
Pt has hx of seizures. Pt stating that she has not been taking her medication "for months." Pt is resting on stretcher at this time with mother at bedside. Pt in NAD and airway is intact.

## 2017-06-19 NOTE — Discharge Instructions (Signed)
Seizures may happen at any time. It is important to take certain precautions to maintain your safety.   Follow up with your doctor in 1-3 days with your Neurologist. It is imperative that you take your seizure medication. Having a seizure can lead to significant harm to you and your brain, may put you in danger of getting seriously hurt, and may even cause your death.  Resume your lamictal. For the next two days take keppra 500mg  twice a day, then for the next 2 days take keppra 500mg  once a day and then stop. Continue your lacmictal dose unchanged even while taking the keppra  During a seizure, a person may injure himself or herself. Seizure precautions are guidelines that a person can follow in order to minimize injury during a seizure. For any activity, it is important to ask, "What would happen if I had a seizure while doing this?" Follow the below precautions.  Bathroom Safety  A person with seizures may want to shower instead of bathe to avoid accidental drowning. If falls occur during the patient's typical seizure, a person should use a shower seat, preferably one with a safety strap.  Use nonskid strips in your shower or tub.  Never use electrical equipment near water. This prevents accidental electrocution.  Consider changing glass in shower doors to shatterproof glass.  Secondary school teacherKitchen Safety If possible, cook when someone else is nearby.  Use the back burners of the stove to prevent accidental burns.  Use shatterproof containers as much as possible. For instance, sauces can be transferred from glass bottles to plastic containers for use.  Limit time that is required using knives or other sharp objects. If possible, buy foods that are already cut, or ask someone to help in meal preparation.   General Safety at Home Do not smoke or light fires in the fireplace unless someone else is present.  Do not use space heaters that can be accidentally overturned.  When alone, avoid using step stools or  ladders, and do not clean rooftop gutters.  Purchase power tools and motorized Risk managerlawn equipment which have a safety switch that will stop the machine if you release the handle (a 'dead man's' switch).   Driving and Transportation Avoid driving unless your seizures are well controlled and/or you have permission to drive from your state's Department of Motor Vehicles  [facility](DMV). Each state has different laws. Please refer to the following link on the Epilepsy Foundation of America's website for more information: http://www.epilepsyfoundation.org/answerplace/Social/driving/drivingu.cfm  If you ride a bicycle, wear a helmet and any other necessary protective gear.  When taking public transportation like the bus or subway, stay clear of the platform edge.   Outdoor Theatre managerand Sports Safety Swimming is okay, but does present certain risks. Never swim alone, and tell friends what to do if you have a seizure while swimming.  Wear appropriate protective equipment.  Ski with a friend. If a seizure occurs, your friend can seek help, if needed. He or she can also help to get you out of the cold. Consider using a safety hook or belt while riding the ski lift.

## 2017-06-19 NOTE — ED Notes (Signed)
Report received , care assumed, pt remain in postictal state even unlabored respirations, vitals wnl , IV keppra infusing , safety precautions intact, fall risks with yellow socks/wrist band, seizure pads on side rails , mom at bedside.

## 2017-06-19 NOTE — ED Notes (Signed)
Nurse has padded rails and placed falls band and socks on pt.

## 2017-06-19 NOTE — ED Notes (Signed)
Pt was resituated in bed. Pt was attempting to crawl out of bed and is confused. Nurse repositioned pt, placed her in a dry gown, and gave her a dry blanket. Pt O2 97% on RA. Pt is still disorientated. Nurse recalled pharmacy for Keppra PIV.

## 2017-06-19 NOTE — ED Notes (Signed)
Pt experience another seizure. Pt given medication Ativan. Dr. Don PerkingVeronese to bedside. Pt was placed on NRB and suctioned by Tobi BastosAnna, Charity fundraiserN. Pt was experiencing snoring respirations. Pt was repositioned to maintain clear airway. Pt did not loose control of bowels or urine. No emesis noted. Pt is resting still at this time.

## 2017-06-19 NOTE — ED Notes (Signed)
Pt c/o HA. Dr. Don PerkingVeronese will be notified. Pt's mother stating that she normally does get a HA after having a seizure.

## 2017-06-19 NOTE — ED Notes (Signed)
Pt assisted to restroom. Pt in NAD and able to ambulate without difficulty.

## 2017-06-19 NOTE — ED Notes (Signed)
Pt awake, assisted by family at bedside to the restroom,
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
## 2017-06-19 NOTE — ED Provider Notes (Signed)
[facility] Emergency Department Provider Note  ____________________________________________  Time seen: Approximately 8:34 AM  I have reviewed the triage vital signs and the nursing notes.   HISTORY  Chief Complaint Seizures  Level 5 caveat:  Portions of the history and physical were unable to be obtained due to post-ictal   HPI Patricia Guerra is a 26 y.o. 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 Patient reports that she has been out of her antiepileptics for several months. She just came off 3rd shift from work and was going home with her mother driving her when she had a generalized tonic-clonicseizure. Patient still slightly confused. No trauma as this happened while she was sitting on the car. She is unable to tell me why she is not taking her seizure medications. Patient was [facility].   Past Medical History:  Diagnosis Date  . Seizures (HCC)     There are no active problems to display for this patient.   History reviewed. No pertinent surgical history.  Prior to Admission medications   Medication Sig Start Date End Date Taking? Authorizing Provider  azithromycin (ZITHROMAX) 250 MG tablet Take 1 tablet (250 mg total) by mouth daily. Take first 2 tablets together, then 1 every day until finished. Patient not taking: Reported on 06/05/2016 07/06/15   Rae HalstedLee, Laurie W, PA-C  benzonatate (TESSALON) 100 MG capsule Take 1 capsule (100 mg total) by mouth 3 (three) times daily as needed. Patient not taking: Reported on 06/05/2016 07/06/15   Rae HalstedLee, Laurie W, PA-C  cyclobenzaprine (FLEXERIL) 5 MG tablet Take 1 tablet (5 mg total) by mouth every 8 (eight) hours as needed for muscle spasms (or pain. Do not drive or operate machinery while taking as can cause drowsiness.). Patient not taking: Reported on 06/05/2016 03/22/15   Renford DillsMiller,  Lindsey, NP  levETIRAcetam (KEPPRA) 500 MG tablet Take 500mg  twice a day for 2 days Take 500mg  once a day for 2 days Stop 06/19/17   Nita SickleVeronese, [place], MD    Allergies Penicillins  History reviewed. No pertinent family history.  Social History Social History  Substance Use Topics  . Smoking status: Current Every Day Smoker    Packs/day: 0.50    Types: Cigarettes  . Smokeless tobacco: Never Used  . Alcohol use No    Review of Systems  Constitutional: Negative for fever. Eyes: Negative for visual changes. ENT: Negative for sore throat. Neck: No neck pain  Cardiovascular: Negative for chest pain. Respiratory: Negative for shortness of breath. Gastrointestinal: Negative for abdominal pain, vomiting or diarrhea. Genitourinary: Negative for dysuria. Musculoskeletal: Negative for back pain. Skin: Negative for rash. Neurological: Negative for headaches, weakness or numbness. + seizure Psych: No SI or HI  ____________________________________________   PHYSICAL EXAM:  VITAL SIGNS: Vitals:   06/19/17 1447 06/19/17 1449  BP: (!) 91/55 94/63  Pulse: 75   Resp: 16   Temp:     Constitutional: Alert and oriented x1, no apparent distress. HEENT:      Head: Normocephalic and atraumatic.         Eyes: Conjunctivae are normal. Sclera is non-icteric. PERRL      Mouth/Throat: Mucous membranes are moist.       Neck: Supple with no signs of meningismus. Cardiovascular: Regular rate and rhythm. No murmurs, gallops, or rubs. 2+ symmetrical distal pulses are present in all extremities. No JVD. Respiratory: Normal respiratory  effort. Lungs are clear to auscultation bilaterally. No wheezes, crackles, or rhonchi.  Gastrointestinal: Soft, non tender, and non distended with positive bowel sounds. No rebound or guarding. Musculoskeletal: Nontender with normal range of motion in all extremities. No edema, cyanosis, or erythema of extremities. Neurologic: Normal speech and language. Face is  symmetric. Moving all extremities. No gross focal neurologic deficits are appreciated. Skin: Skin is warm, dry and intact. No rash noted. Psychiatric: Mood and affect are normal. Speech and behavior are normal.  ____________________________________________   LABS (all labs ordered are listed, but only abnormal results are displayed)  Labs Reviewed  BASIC METABOLIC PANEL - Abnormal; Notable for the following:       Result Value   CO2 18 (*)    Creatinine, Ser 1.13 (*)    Anion gap 16 (*)    All other components within normal limits  HCG, QUANTITATIVE, PREGNANCY  CBC   ____________________________________________  EKG  none  ____________________________________________  RADIOLOGY  none  ____________________________________________   PROCEDURES  Procedure(s) performed: None Procedures Critical Care performed:  None ____________________________________________   INITIAL IMPRESSION / ASSESSMENT AND PLAN / ED COURSE   26 y.o. 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 patient is still slight postictal period.  She is supposed to be on lamictal. Will discuss with neurology as lamictal has no IV loading dose. Will monitor closely for any other seizures. Will check basic labs and pregnancy test.  Clinical Course as of Jun 19 1514  Thu Jun 19, 2017  1049 Patient received her Lamictal 15-20 minutes ago and had another seizure in the emergency room. She was given 2 mg of IV Ativan. Patient will be loaded with Keppra.  [CV]    Clinical Course User Index [CV] Don PerkingVeronese, [place], MD    _________________________ 2:52 PM on 06/19/2017 -----------------------------------------  Patient has been at baseline for several hours, ambulating, tolerating by mouth. I discussed with Dr. Thad Rangereynolds who recommended a short taper  off Keppra while on lamictal . I had a serious conversation with patient about the dangers of being noncompliant with her medication. Highly encouraged that she takes it as prescribed. Recommend close follow-up with her neurologist. Seizure precaution instructions have been given to patient.  Pertinent labs & imaging results that were available during my care of the patient were reviewed by me and considered in my medical decision making (see chart for details).    ____________________________________________   FINAL CLINICAL IMPRESSION(S) / ED DIAGNOSES  Final diagnoses:  Seizure (HCC)      NEW MEDICATIONS STARTED DURING THIS VISIT:  New Prescriptions   LEVETIRACETAM (KEPPRA) 500 MG TABLET    Take 500mg  twice a day for 2 days Take 500mg  once a day for 2 days Stop     Note:  This document was prepared using Dragon voice recognition software and may include unintentional dictation errors.    Don PerkingVeronese, [place], MD 06/19/17 [phone]

## 2017-07-30 ENCOUNTER — Encounter: Payer: Self-pay | Admitting: Emergency Medicine

## 2017-07-30 ENCOUNTER — Emergency Department
Admission: EM | Admit: 2017-07-30 | Discharge: 2017-07-30 | Disposition: A | Discharge status: Home / Self Care | Payer: Self-pay | Attending: Emergency Medicine | Admitting: Emergency Medicine

## 2017-07-30 DIAGNOSIS — Z79899 Other long term (current) drug therapy: Secondary | ICD-10-CM | POA: Insufficient documentation

## 2017-07-30 DIAGNOSIS — R569 Unspecified convulsions: Secondary | ICD-10-CM | POA: Insufficient documentation

## 2017-07-30 DIAGNOSIS — F17210 Nicotine dependence, cigarettes, uncomplicated: Secondary | ICD-10-CM | POA: Insufficient documentation

## 2017-07-30 LAB — COMPREHENSIVE METABOLIC PANEL
ALBUMIN: 4.7 g/dL (ref 3.5–5.0)
ALT: 15 U/L (ref 14–54)
AST: 26 U/L (ref 15–41)
Alkaline Phosphatase: 46 U/L (ref 38–126)
Anion gap: 8 (ref 5–15)
BUN: 11 mg/dL (ref 6–20)
CHLORIDE: 103 mmol/L (ref 101–111)
CO2: 27 mmol/L (ref 22–32)
Calcium: 9.3 mg/dL (ref 8.9–10.3)
Creatinine, Ser: 0.95 mg/dL (ref 0.44–1.00)
GFR calc Af Amer: >60 mL/min (ref >60)
GFR calc non Af Amer: >60 mL/min (ref >60)
GLUCOSE: 136 mg/dL — AB (ref 65–99)
POTASSIUM: 3.5 mmol/L (ref 3.5–5.1)
SODIUM: 138 mmol/L (ref 135–145)
Total Bilirubin: 1.1 mg/dL (ref 0.3–1.2)
Total Protein: 7.4 g/dL (ref 6.5–8.1)

## 2017-07-30 LAB — CBC
HCT: 42.7 % (ref 35.0–47.0)
Hemoglobin: 14.5 g/dL (ref 12.0–16.0)
MCH: 30.3 pg (ref 26.0–34.0)
MCHC: 34 g/dL (ref 32.0–36.0)
MCV: 89.2 fL (ref 80.0–100.0)
PLATELETS: 235 10*3/uL (ref 150–440)
RBC: 4.79 MIL/uL (ref 3.80–5.20)
RDW: 12.6 % (ref 11.5–14.5)
WBC: 8.6 10*3/uL (ref 3.6–11.0)

## 2017-07-30 LAB — URINALYSIS, COMPLETE (UACMP) WITH MICROSCOPIC
Bacteria, UA: NONE SEEN
Bilirubin Urine: NEGATIVE
Glucose, UA: NEGATIVE mg/dL
Ketones, ur: NEGATIVE mg/dL
Nitrite: NEGATIVE
PROTEIN: NEGATIVE mg/dL
RBC / HPF: NONE SEEN RBC/hpf (ref 0–5)
Specific Gravity, Urine: 1.003 — ABNORMAL LOW (ref 1.005–1.030)
pH: 6 (ref 5.0–8.0)

## 2017-07-30 LAB — POCT PREGNANCY, URINE: PREG TEST UR: NEGATIVE

## 2017-07-30 MED ORDER — LEVETIRACETAM 500 MG PO TABS
500.0000 mg | ORAL_TABLET | Freq: Once | ORAL | Status: AC
  Administered 2017-07-30: 500 mg via ORAL
  Filled 2017-07-30: qty 1

## 2017-07-30 MED ORDER — LEVETIRACETAM 500 MG PO TABS: 500.0000 mg | ORAL_TABLET | Freq: Two times a day (BID) | ORAL | 0 refills | Status: DC

## 2017-07-30 NOTE — ED Notes (Signed)
Per mom, call Patricia BridgeMartha Self for pick up of patient. [phone]

## 2017-07-30 NOTE — ED Triage Notes (Signed)
Pt reports had 4 seizures last night. Per pt mom, each seizure lasted approxiamately 30  Seconds. Pt mom reports that patient had her first seizure in January of this year and they are still waiting to get in with a neurologist to find out the cause. Pt c/o pain to back from seizure. Pt mom reports she fell in the floor with the first one but the others she was sitting on the couch.

## 2017-07-30 NOTE — ED Provider Notes (Signed)
[facility] Emergency Department Provider Note  Time seen: 12:09 PM  I have reviewed the triage vital signs and the nursing notes.   HISTORY  Chief Complaint Seizures    HPI [name] is a 26 y.o. female Transgender, seizure disorder, presents to the emergency department with seizures yesterday. 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 Yesterday the patient states he had 4 seizures and did not go to work. Has not had any seizures today but has felt "out of it." Denies any chest pain, abdominal pain, recent illnesses, largely negative review of systems. Patient states he was diagnosed with a seizure disorder in [facility]. Was initially placed on Lamictal the medication made him feel unwell so he discontinued it.patient scheduled to see neurology at the end of this month. He states they have been unable to get an appointment until the end of this month. With the patient's recent increase in the frequency of seizures his mother wanted him to come to the emergency department to get checked out.  Past Medical History:  Diagnosis Date  . Seizures (HCC)     There are no active problems to display for this patient.   History reviewed. No pertinent surgical history.  Prior to Admission medications   Medication Sig Start Date End Date Taking? Authorizing Provider  azithromycin (ZITHROMAX) 250 MG tablet Take 1 tablet (250 mg total) by mouth daily. Take first 2 tablets together, then 1 every day until finished. Patient not taking: Reported on 06/05/2016 07/06/15   Rae HalstedLee, Laurie W, PA-C  benzonatate (TESSALON) 100 MG capsule Take 1 capsule (100 mg total) by mouth 3 (three) times daily as needed. Patient not taking: Reported on 06/05/2016 07/06/15   Rae HalstedLee, Laurie W, PA-C  cyclobenzaprine (FLEXERIL) 5 MG tablet Take 1 tablet (5 mg total) by mouth every 8 (eight) hours as needed for muscle spasms (or  pain. Do not drive or operate machinery while taking as can cause drowsiness.). Patient not taking: Reported on 06/05/2016 03/22/15   Renford DillsMiller, Lindsey, NP  levETIRAcetam (KEPPRA) 500 MG tablet Take 500mg  twice a day for 2 days Take 500mg  once a day for 2 days Stop 06/19/17   Nita SickleVeronese, [place], MD    Allergies  Allergen Reactions  . Penicillins Swelling    Has patient had a PCN reaction causing immediate rash, facial/tongue/throat swelling, SOB or lightheadedness with hypotension: Yes Has patient had a PCN reaction causing severe rash involving mucus membranes or skin necrosis: Unknown Has patient had a PCN reaction that required hospitalization: Unknown Has patient had a PCN reaction occurring within the last 10 years: Unknown If all of the above answers are "NO", then may proceed with Cephalosporin use.    No family history on file.  Social History Social History  Substance Use Topics  . Smoking status: Current Every Day Smoker    Packs/day: 0.50    Types: Cigarettes  . Smokeless tobacco: Never Used  . Alcohol use No    Review of Systems Constitutional: Negative for fever. Cardiovascular: Negative for chest pain. Respiratory: Negative for shortness of breath. Gastrointestinal: Negative for abdominal pain, vomiting and diarrhea. Genitourinary: Negative for dysuria. Neurological: Negative for headache All other ROS negative  ____________________________________________   PHYSICAL EXAM:  VITAL SIGNS: ED Triage Vitals  Enc Vitals Group     BP 07/30/17 0852 126/80     Pulse Rate 07/30/17 0852 76  Resp 07/30/17 0852 18     Temp 07/30/17 0852 (!) 97.5 F (36.4 C)     Temp Source 07/30/17 0852 Oral     SpO2 07/30/17 0852 99 %     Weight 07/30/17 0900 100 lb (45.4 kg)     Height 07/30/17 0900  (1.626 m)     Head Circumference --      Peak Flow --      Pain Score 07/30/17 0900 4     Pain Loc --      Pain Edu? --      Excl. in GC? --     Constitutional: Alert  and oriented. Well appearing and in no distress. Eyes: Normal exam ENT   Head: Normocephalic and atraumatic.   Mouth/Throat: Mucous membranes are moist. Cardiovascular: Normal rate, regular rhythm. No murmur Respiratory: Normal respiratory effort without tachypnea nor retractions. Breath sounds are clear  Gastrointestinal: Soft and nontender. No distention.   Musculoskeletal: Nontender with normal range of motion in all extremities Neurologic:  Normal speech and language. No gross focal neurologic deficits  Skin:  Skin is warm, dry and intact.  Psychiatric: Mood and affect are normal.  ____________________________________________   INITIAL IMPRESSION / ASSESSMENT AND PLAN / ED COURSE  Pertinent labs & imaging results that were available during my care of the patient were reviewed by me and considered in my medical decision making (see chart for details).  patient presents to the emergency department with possible seizures yesterday. Patient states yesterday he felt very anxious with a "wave of anxiety" followed by 4 brief seizures. States his last seizure was partially 10 PM last night. He states in February he had an EEG performed at [facility] but said he was "not epileptic" Per patient. however the patient has been awaiting neurology follow-up which is scheduled later this month. They discharged the patient on Lamictal however the patient states he could not take it because it made him feel very unwell and nauseated. Patient's workup in the emergency departments largely within normal limits. Labs are reassuring. Physical exam is normal. However given his recent increase in seizure-like activity and we are still pending neurology visit in 2 weeks we will start the patient on Keppra until he can be seen by neurology. At that time we will defer to neurology as to keep the patient on medication or not.  ____________________________________________   FINAL CLINICAL IMPRESSION(S) / ED  DIAGNOSES  seizure-like activity    Minna Antis, MD 07/30/17 1215

## 2017-07-30 NOTE — Discharge Instructions (Signed)
[facility] as scheduled this month. Please take your medication twice daily until you are seen by neurology. Do not drink alcohol with taking this medication. Do not drive until you have been cleared by neurology.

## 2017-07-30 NOTE — ED Notes (Signed)
Pt verbalizes understanding of d/c teaching and Rx. Pt denies any further questions, pt in NAD at time of d/c, ambulatory to lobby. Pt called ride and is waiting in lobby.

## 2017-07-30 NOTE — ED Notes (Signed)
Mom is leaving for work but leaves her number to contact if needed.  [phone] Joylene GrapesPatricia French

## 2017-10-07 ENCOUNTER — Other Ambulatory Visit: Payer: Self-pay

## 2017-10-07 ENCOUNTER — Emergency Department (HOSPITAL_COMMUNITY)
Admission: EM | Admit: 2017-10-07 | Discharge: 2017-10-07 | Disposition: A | Discharge status: Home / Self Care | Payer: Self-pay | Attending: Emergency Medicine | Admitting: Emergency Medicine

## 2017-10-07 ENCOUNTER — Encounter (HOSPITAL_COMMUNITY): Payer: Self-pay | Admitting: Nurse Practitioner

## 2017-10-07 DIAGNOSIS — F17210 Nicotine dependence, cigarettes, uncomplicated: Secondary | ICD-10-CM | POA: Insufficient documentation

## 2017-10-07 DIAGNOSIS — G40909 Epilepsy, unspecified, not intractable, without status epilepticus: Secondary | ICD-10-CM | POA: Insufficient documentation

## 2017-10-07 DIAGNOSIS — R569 Unspecified convulsions: Secondary | ICD-10-CM

## 2017-10-07 DIAGNOSIS — Z79899 Other long term (current) drug therapy: Secondary | ICD-10-CM | POA: Insufficient documentation

## 2017-10-07 LAB — COMPREHENSIVE METABOLIC PANEL
ALT: 15 U/L (ref 14–54)
AST: 23 U/L (ref 15–41)
Albumin: 4.7 g/dL (ref 3.5–5.0)
Alkaline Phosphatase: 52 U/L (ref 38–126)
Anion gap: 8 (ref 5–15)
BUN: 8 mg/dL (ref 6–20)
CHLORIDE: 106 mmol/L (ref 101–111)
CO2: 25 mmol/L (ref 22–32)
CREATININE: 0.73 mg/dL (ref 0.44–1.00)
Calcium: 9.4 mg/dL (ref 8.9–10.3)
Glucose, Bld: 100 mg/dL — ABNORMAL HIGH (ref 65–99)
POTASSIUM: 3.7 mmol/L (ref 3.5–5.1)
SODIUM: 139 mmol/L (ref 135–145)
Total Bilirubin: 1 mg/dL (ref 0.3–1.2)
Total Protein: 7.7 g/dL (ref 6.5–8.1)

## 2017-10-07 LAB — CBC WITH DIFFERENTIAL/PLATELET
Basophils Absolute: 0 10*3/uL (ref 0.0–0.1)
Basophils Relative: 0 %
EOS ABS: 0 10*3/uL (ref 0.0–0.7)
EOS PCT: 0 %
HCT: 38.1 % (ref 36.0–46.0)
Hemoglobin: 13.1 g/dL (ref 12.0–15.0)
LYMPHS ABS: 0.8 10*3/uL (ref 0.7–4.0)
Lymphocytes Relative: 7 %
MCH: 30.4 pg (ref 26.0–34.0)
MCHC: 34.4 g/dL (ref 30.0–36.0)
MCV: 88.4 fL (ref 78.0–100.0)
MONOS PCT: 5 %
Monocytes Absolute: 0.5 10*3/uL (ref 0.1–1.0)
Neutro Abs: 9.4 10*3/uL — ABNORMAL HIGH (ref 1.7–7.7)
Neutrophils Relative %: 88 %
PLATELETS: 222 10*3/uL (ref 150–400)
RBC: 4.31 MIL/uL (ref 3.87–5.11)
RDW: 12.4 % (ref 11.5–15.5)
WBC: 10.7 10*3/uL — ABNORMAL HIGH (ref 4.0–10.5)

## 2017-10-07 MED ORDER — LEVETIRACETAM 500 MG PO TABS: 500.0000 mg | ORAL_TABLET | Freq: Two times a day (BID) | ORAL | 1 refills | Status: DC

## 2017-10-07 MED ORDER — IBUPROFEN 800 MG PO TABS
800.0000 mg | ORAL_TABLET | Freq: Once | ORAL | Status: AC
  Administered 2017-10-07: 800 mg via ORAL
  Filled 2017-10-07: qty 1

## 2017-10-07 MED ORDER — SODIUM CHLORIDE 0.9 % IV SOLN
500.0000 mg | Freq: Once | INTRAVENOUS | Status: AC
  Administered 2017-10-07: 500 mg via INTRAVENOUS
  Filled 2017-10-07: qty 5

## 2017-10-07 MED ORDER — SODIUM CHLORIDE 0.9 % IV BOLUS (SEPSIS)
1000.0000 mL | Freq: Once | INTRAVENOUS | Status: AC
  Administered 2017-10-07: 1000 mL via INTRAVENOUS

## 2017-10-07 NOTE — Discharge Instructions (Signed)
Restart your Keppra.  Prescription given.  No suturing required on your forehead.  Simple dressing.  Follow-up with a neurologist.  Phone number given.

## 2017-10-07 NOTE — ED Notes (Signed)
Bed: WA24 Expected date:  Expected time:  Means of arrival:  Comments: HOLD FOR HALL C 

## 2017-10-07 NOTE — ED Triage Notes (Signed)
Patient stopped taking keppra about a month ago. Patient had a seizure at [place] bathroom. Above right eye lac from fall.

## 2017-10-07 NOTE — ED Provider Notes (Signed)
[facility]-EMERGENCY DEPT Provider Note   CSN: [id number] Arrival date & time: 10/07/17  [id number]     History   Chief Complaint No chief complaint on file.   HPI Patricia Guerra is a 26 y.o. female.  Patient presents with a "seizure" today while eating breakfast with her mother at [employer].  She has a known history of seizures, but has been noncompliant with her Keppra lately.  No prodromal illnesses.  Review of systems small abrasion above right eye.  No fever, sweats, chills, stiff neck.  She is feeling back to baseline now.      Past Medical History:  Diagnosis Date  . Seizures (HCC)     There are no active problems to display for this patient.   History reviewed. No pertinent surgical history.  OB History    No data available       Home Medications    Prior to Admission medications   Medication Sig Start Date End Date Taking? Authorizing Provider  clonazePAM (KLONOPIN) 0.5 MG tablet Take 0.125 mg by mouth daily. 08/05/17  Yes [provider]  ibuprofen (ADVIL,MOTRIN) 200 MG tablet Take 600-800 mg by mouth every 4 (four) hours as needed for fever, headache, mild pain, moderate pain or cramping.   Yes [provider]  levETIRAcetam (KEPPRA) 500 MG tablet Take 1 tablet (500 mg total) by mouth 2 (two) times daily. 07/30/17  Yes Minna AntisPaduchowski, Kevin, MD  levETIRAcetam (KEPPRA) 500 MG tablet Take 1 tablet (500 mg total) by mouth 2 (two) times daily. 10/07/17   Donnetta Hutchingook, Javonne Louissaint, MD    Family History History reviewed. No pertinent family history.  Social History Social History   Tobacco Use  . Smoking status: Current Every Day Smoker    Packs/day: 0.50    Types: Cigarettes  . Smokeless tobacco: Never Used  Substance Use Topics  . Alcohol use: No  . Drug use: Yes    Types: Marijuana     Allergies   Penicillins   Review of Systems Review of Systems  All other systems reviewed and are negative.    Physical  Exam Updated Vital Signs BP 110/78   Pulse 100   Temp 98.5 F (36.9 C)   Resp 18   SpO2 99%   Physical Exam  Constitutional: She is oriented to person, place, and time. She appears well-developed and well-nourished.  HENT:  Head: Normocephalic.  0.5 cm abrasion right forehead  Eyes: Conjunctivae are normal.  Neck: Neck supple.  Cardiovascular: Normal rate and regular rhythm.  Pulmonary/Chest: Effort normal and breath sounds normal.  Abdominal: Soft. Bowel sounds are normal.  Musculoskeletal: Normal range of motion.  Neurological: She is alert and oriented to person, place, and time.  Skin: Skin is warm and dry.  Psychiatric: She has a normal mood and affect. Her behavior is normal.  Nursing note and vitals reviewed.    ED Treatments / Results  Labs (all labs ordered are listed, but only abnormal results are displayed) Labs Reviewed  CBC WITH DIFFERENTIAL/PLATELET - Abnormal; Notable for the following components:      Result Value   WBC 10.7 (*)    Neutro Abs 9.4 (*)    All other components within normal limits  COMPREHENSIVE METABOLIC PANEL - Abnormal; Notable for the following components:   Glucose, Bld 100 (*)    All other components within normal limits    EKG  EKG Interpretation None       Radiology No results found.  Procedures Procedures (including critical care time)  Medications Ordered in ED Medications  sodium chloride 0.9 % bolus 1,000 mL (0 mLs Intravenous Stopped 10/07/17 1100)  levETIRAcetam (KEPPRA) 500 mg in sodium chloride 0.9 % 100 mL IVPB (0 mg Intravenous Stopped 10/07/17 1102)  ibuprofen (ADVIL,MOTRIN) tablet 800 mg (800 mg Oral Given 10/07/17 1102)     Initial Impression / Assessment and Plan / ED Course  I have reviewed the triage vital signs and the nursing notes.  Pertinent labs & imaging results that were available during my care of the patient were reviewed by me and considered in my medical decision making (see chart for  details).     Patient has a known seizure disorder.  She apparently had a seizure this morning.  She has been noncompliant with her meds.  Will re-prescribe Keppra 500 mg bid.  Referral to neurology.  Disc c pt and mother.  Final Clinical Impressions(s) / ED Diagnoses   Final diagnoses:  Seizure [facility](HCC)    ED Discharge Orders        Ordered    levETIRAcetam (KEPPRA) 500 MG tablet  2 times daily     10/07/17 1117       Donnetta Hutchingook, Elzy Tomasello, MD 10/07/17 1135

## 2017-11-25 DIAGNOSIS — Z789 Other specified health status: Secondary | ICD-10-CM | POA: Diagnosis present

## 2017-11-25 DIAGNOSIS — F419 Anxiety disorder, unspecified: Secondary | ICD-10-CM | POA: Diagnosis present

## 2019-01-13 ENCOUNTER — Other Ambulatory Visit: Payer: Self-pay

## 2019-01-13 ENCOUNTER — Emergency Department
Admission: EM | Admit: 2019-01-13 | Discharge: 2019-01-13 | Disposition: A | Discharge status: Home / Self Care | Payer: Self-pay | Attending: Emergency Medicine | Admitting: Emergency Medicine

## 2019-01-13 ENCOUNTER — Encounter: Payer: Self-pay | Admitting: Emergency Medicine

## 2019-01-13 DIAGNOSIS — Y92000 Kitchen of unspecified non-institutional (private) residence as  the place of occurrence of the external cause: Secondary | ICD-10-CM | POA: Insufficient documentation

## 2019-01-13 DIAGNOSIS — Y93G3 Activity, cooking and baking: Secondary | ICD-10-CM | POA: Insufficient documentation

## 2019-01-13 DIAGNOSIS — Z79899 Other long term (current) drug therapy: Secondary | ICD-10-CM | POA: Insufficient documentation

## 2019-01-13 DIAGNOSIS — S61012A Laceration without foreign body of left thumb without damage to nail, initial encounter: Secondary | ICD-10-CM | POA: Insufficient documentation

## 2019-01-13 DIAGNOSIS — W260XXA Contact with knife, initial encounter: Secondary | ICD-10-CM | POA: Insufficient documentation

## 2019-01-13 DIAGNOSIS — Y998 Other external cause status: Secondary | ICD-10-CM | POA: Insufficient documentation

## 2019-01-13 DIAGNOSIS — F17210 Nicotine dependence, cigarettes, uncomplicated: Secondary | ICD-10-CM | POA: Insufficient documentation

## 2019-01-13 NOTE — ED Notes (Signed)
Patient has laceration on left thumb from a brand new kitchen knife.

## 2019-01-13 NOTE — ED Triage Notes (Signed)
Patient ambulatory to triage with steady gait, without difficulty or distress noted; pt reports cutting left thumb with kitchen knife PTA; approx 1" lac noted with small amount bleeding; site clensed and dressing applied

## 2019-01-13 NOTE — ED Provider Notes (Signed)
[facility] Emergency Department Provider Note  ____________________________________________  Time seen: Approximately 9:34 PM  I have reviewed the triage vital signs and the nursing notes.   HISTORY  Chief Complaint Laceration    HPI Patricia Guerra is a 28 y.o. female who presents the emergency department for laceration to the left thumb.  Patient was using a kitchen knife preparing a meal when it slipped and lacerated the distal phalanx of the finger.  Patient reports laceration approximately 1 inch in length.  Bleeding was controlled prior to arrival.  Full range of motion to the digit.  Tetanus shot up-to-date.  No other complaints at this time.    Past Medical History:  Diagnosis Date  . Seizures (HCC)     There are no active problems to display for this patient.   History reviewed. No pertinent surgical history.  Prior to Admission medications   Medication Sig Start Date End Date Taking? Authorizing Provider  clonazePAM (KLONOPIN) 0.5 MG tablet Take 0.125 mg by mouth daily. 08/05/17   [provider]  ibuprofen (ADVIL,MOTRIN) 200 MG tablet Take 600-800 mg by mouth every 4 (four) hours as needed for fever, headache, mild pain, moderate pain or cramping.    [provider]  levETIRAcetam (KEPPRA) 500 MG tablet Take 1 tablet (500 mg total) by mouth 2 (two) times daily. 07/30/17   Minna Antis, MD  levETIRAcetam (KEPPRA) 500 MG tablet Take 1 tablet (500 mg total) by mouth 2 (two) times daily. 10/07/17   Donnetta Hutching, MD    Allergies Penicillins  No family history on file.  Social History Social History   Tobacco Use  . Smoking status: Current Every Day Smoker    Packs/day: 0.50    Types: Cigarettes  . Smokeless tobacco: Never Used  Substance Use Topics  . Alcohol use: No  . Drug use: Yes    Types: Marijuana     Review of Systems  Constitutional: No fever/chills Eyes: No visual changes.  Cardiovascular: no  chest pain. Respiratory: no cough. No SOB. Gastrointestinal: No abdominal pain.  No nausea, no vomiting.   Musculoskeletal: Negative for musculoskeletal pain. Skin: Positive for laceration to the left thumb Neurological: Negative for headaches, focal weakness or numbness. 10-point ROS otherwise negative.  ____________________________________________   PHYSICAL EXAM:  VITAL SIGNS: ED Triage Vitals  Enc Vitals Group     BP 01/13/19 2112 (!) 139/93     Pulse Rate 01/13/19 2112 (!) 108     Resp 01/13/19 2112 20     Temp 01/13/19 2112 98 F (36.7 C)     Temp Source 01/13/19 2112 Oral     SpO2 01/13/19 2112 97 %     Weight 01/13/19 2111 106 lb (48.1 kg)     Height 01/13/19 2111 5\' 5"  (1.651 m)     Head Circumference --      Peak Flow --      Pain Score 01/13/19 2111 8     Pain Loc --      Pain Edu? --      Excl. in GC? --      Constitutional: Alert and oriented. Well appearing and in no acute distress. Eyes: Conjunctivae are normal. PERRL. EOMI. Head: Atraumatic. Neck: No stridor.    Cardiovascular: Normal rate, regular rhythm. Normal S1 and S2.  Good peripheral circulation. Respiratory: Normal respiratory effort without tachypnea or retractions. Lungs CTAB. Good air entry to the bases with no decreased or absent breath sounds. Musculoskeletal: Full range  of motion to all extremities. No gross deformities appreciated.  Visualization of the left arm reveals linear laceration with smooth edges measuring approximately 2.5 cm in length.  Relatively superficial in nature.  No active bleeding.  No foreign body.  Full range of motion to the digit.  Sensation and cap refill intact. Neurologic:  Normal speech and language. No gross focal neurologic deficits are appreciated.  Skin:  Skin is warm, dry and intact. No rash noted. Psychiatric: Mood and affect are normal. Speech and behavior are normal. Patient exhibits appropriate insight and  judgement.   ____________________________________________   LABS (all labs ordered are listed, but only abnormal results are displayed)  Labs Reviewed - No data to display ____________________________________________  EKG   ____________________________________________  RADIOLOGY   No results found.  ____________________________________________    PROCEDURES  Procedure(s) performed:    Marland KitchenMarland KitchenLaceration Repair Date/Time: 01/13/2019 9:35 PM Performed by: Racheal Patches, PA-C Authorized by: Racheal Patches, PA-C   Consent:    Consent obtained:  Verbal   Consent given by:  Patient Anesthesia (see MAR for exact dosages):    Anesthesia method:  None Laceration details:    Location:  Finger   Finger location:  L thumb   Length (cm):  2.5 Repair type:    Repair type:  Simple Exploration:    Hemostasis achieved with:  Direct pressure   Wound exploration: wound explored through full range of motion and entire depth of wound probed and visualized     Wound extent: no foreign bodies/material noted, no muscle damage noted, no nerve damage noted, no tendon damage noted and no underlying fracture noted   Treatment:    Area cleansed with:  Shur-Clens   Amount of cleaning:  Standard Skin repair:    Repair method:  Tissue adhesive Approximation:    Approximation:  Close Post-procedure details:    Dressing:  Open (no dressing)   Patient tolerance of procedure:  Tolerated well, no immediate complications      Medications - No data to display   ____________________________________________   INITIAL IMPRESSION / ASSESSMENT AND PLAN / ED COURSE  Pertinent labs & imaging results that were available during my care of the patient were reviewed by me and considered in my medical decision making (see chart for details).  Review of the [place] CSRS was performed in accordance of the [facility] prior to dispensing any controlled drugs.      Patient's diagnosis is  consistent with Thumb laceration.  Patient presents emergency department complaining of laceration to the left thumb.  Relatively superficial in nature.  This laceration was amenable to closure with glue.  This was accomplished.  Patient was given wound care instructions.  No prescriptions at this time.  Follow-up with primary care as needed.  Patient is given ED precautions to return to the ED for any worsening or new symptoms.     ____________________________________________  FINAL CLINICAL IMPRESSION(S) / ED DIAGNOSES  Final diagnoses:  Laceration of left thumb without foreign body without damage to nail, initial encounter      NEW MEDICATIONS STARTED DURING THIS VISIT:  ED Discharge Orders    None          This chart was dictated using voice recognition software/Dragon. Despite best efforts to proofread, errors can occur which can change the meaning. Any change was purely unintentional.    Lanette Hampshire 01/13/19 2137    Phineas Semen, MD 01/13/19 2141

## 2022-07-02 ENCOUNTER — Encounter: Payer: Self-pay | Admitting: Emergency Medicine

## 2022-07-02 ENCOUNTER — Emergency Department: Payer: Medicaid Other

## 2022-07-02 ENCOUNTER — Other Ambulatory Visit: Payer: Self-pay

## 2022-07-02 ENCOUNTER — Emergency Department
Admission: EM | Admit: 2022-07-02 | Discharge: 2022-07-02 | Disposition: A | Discharge status: Home / Self Care | Payer: Self-pay | Attending: Emergency Medicine | Admitting: Emergency Medicine

## 2022-07-02 DIAGNOSIS — S62346A Nondisplaced fracture of base of fifth metacarpal bone, right hand, initial encounter for closed fracture: Secondary | ICD-10-CM | POA: Insufficient documentation

## 2022-07-02 DIAGNOSIS — W228XXA Striking against or struck by other objects, initial encounter: Secondary | ICD-10-CM | POA: Insufficient documentation

## 2022-07-02 MED ORDER — IBUPROFEN 600 MG PO TABS
600.0000 mg | ORAL_TABLET | Freq: Once | ORAL | Status: AC
  Administered 2022-07-02: 600 mg via ORAL

## 2022-07-02 MED ORDER — ACETAMINOPHEN 325 MG PO TABS
ORAL_TABLET | ORAL | Status: AC
  Filled 2022-07-02: qty 2

## 2022-07-02 MED ORDER — ACETAMINOPHEN 325 MG PO TABS
650.0000 mg | ORAL_TABLET | Freq: Once | ORAL | Status: AC
  Administered 2022-07-02: 650 mg via ORAL

## 2022-07-02 MED ORDER — IBUPROFEN 600 MG PO TABS
ORAL_TABLET | ORAL | Status: AC
  Filled 2022-07-02: qty 1

## 2022-07-02 NOTE — Discharge Instructions (Addendum)
Keep your splint clean and dry.  Please follow-up with orthopedics.  You may continue to take Tylenol/ibuprofen per package instructions to help with your pain.  Keep your arm elevated.  Return for any new, worsening, or change in symptoms or other concerns.  It was a pleasure caring for you today.
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
## 2023-05-09 DIAGNOSIS — K011 Impacted teeth: Secondary | ICD-10-CM | POA: Diagnosis not present

## 2023-05-09 DIAGNOSIS — K0889 Other specified disorders of teeth and supporting structures: Secondary | ICD-10-CM | POA: Diagnosis not present

## 2023-05-09 DIAGNOSIS — F419 Anxiety disorder, unspecified: Secondary | ICD-10-CM | POA: Diagnosis not present

## 2023-05-09 DIAGNOSIS — R6884 Jaw pain: Secondary | ICD-10-CM | POA: Diagnosis not present

## 2023-05-09 DIAGNOSIS — Z880 Allergy status to penicillin: Secondary | ICD-10-CM | POA: Diagnosis not present

## 2023-05-09 DIAGNOSIS — R599 Enlarged lymph nodes, unspecified: Secondary | ICD-10-CM | POA: Diagnosis not present

## 2023-05-09 DIAGNOSIS — F17210 Nicotine dependence, cigarettes, uncomplicated: Secondary | ICD-10-CM | POA: Diagnosis not present

## 2023-07-09 DIAGNOSIS — F331 Major depressive disorder, recurrent, moderate: Secondary | ICD-10-CM | POA: Diagnosis not present

## 2023-07-09 DIAGNOSIS — F411 Generalized anxiety disorder: Secondary | ICD-10-CM | POA: Diagnosis not present

## 2023-07-14 DIAGNOSIS — F331 Major depressive disorder, recurrent, moderate: Secondary | ICD-10-CM | POA: Diagnosis not present

## 2023-07-14 DIAGNOSIS — F411 Generalized anxiety disorder: Secondary | ICD-10-CM | POA: Diagnosis not present

## 2023-08-11 DIAGNOSIS — F411 Generalized anxiety disorder: Secondary | ICD-10-CM | POA: Diagnosis not present

## 2023-08-11 DIAGNOSIS — F331 Major depressive disorder, recurrent, moderate: Secondary | ICD-10-CM | POA: Diagnosis not present

## 2023-10-28 DIAGNOSIS — R569 Unspecified convulsions: Secondary | ICD-10-CM | POA: Diagnosis not present

## 2023-10-28 DIAGNOSIS — H1033 Unspecified acute conjunctivitis, bilateral: Secondary | ICD-10-CM | POA: Diagnosis not present

## 2023-10-28 DIAGNOSIS — F17210 Nicotine dependence, cigarettes, uncomplicated: Secondary | ICD-10-CM | POA: Diagnosis not present

## 2023-10-28 DIAGNOSIS — Z880 Allergy status to penicillin: Secondary | ICD-10-CM | POA: Diagnosis not present

## 2023-10-28 DIAGNOSIS — L03213 Periorbital cellulitis: Secondary | ICD-10-CM | POA: Diagnosis not present

## 2023-10-28 DIAGNOSIS — H5789 Other specified disorders of eye and adnexa: Secondary | ICD-10-CM | POA: Diagnosis not present

## 2023-10-28 DIAGNOSIS — B9689 Other specified bacterial agents as the cause of diseases classified elsewhere: Secondary | ICD-10-CM | POA: Diagnosis not present

## 2023-10-28 DIAGNOSIS — F419 Anxiety disorder, unspecified: Secondary | ICD-10-CM | POA: Diagnosis not present

## 2024-08-05 ENCOUNTER — Other Ambulatory Visit: Payer: Self-pay

## 2024-08-05 ENCOUNTER — Emergency Department

## 2024-08-05 ENCOUNTER — Emergency Department
Admission: EM | Admit: 2024-08-05 | Discharge: 2024-08-05 | Disposition: A | Discharge status: Home / Self Care | Attending: Emergency Medicine | Admitting: Emergency Medicine

## 2024-08-05 ENCOUNTER — Encounter: Payer: Self-pay | Admitting: Emergency Medicine

## 2024-08-05 DIAGNOSIS — R55 Syncope and collapse: Secondary | ICD-10-CM | POA: Diagnosis not present

## 2024-08-05 DIAGNOSIS — M542 Cervicalgia: Secondary | ICD-10-CM | POA: Diagnosis not present

## 2024-08-05 DIAGNOSIS — R4020 Unspecified coma: Secondary | ICD-10-CM

## 2024-08-05 DIAGNOSIS — R519 Headache, unspecified: Secondary | ICD-10-CM | POA: Insufficient documentation

## 2024-08-05 DIAGNOSIS — Y92410 Unspecified street and highway as the place of occurrence of the external cause: Secondary | ICD-10-CM | POA: Diagnosis not present

## 2024-08-05 LAB — CBC WITH DIFFERENTIAL/PLATELET
Abs Immature Granulocytes: 0.05 K/uL (ref 0.00–0.07)
Basophils Absolute: 0.1 K/uL (ref 0.0–0.1)
Basophils Relative: 0 %
Eosinophils Absolute: 0 K/uL (ref 0.0–0.5)
Eosinophils Relative: 0 %
HCT: 38.4 % (ref 36.0–46.0)
Hemoglobin: 12.9 g/dL (ref 12.0–15.0)
Immature Granulocytes: 0 %
Lymphocytes Relative: 6 %
Lymphs Abs: 0.7 K/uL (ref 0.7–4.0)
MCH: 30.1 pg (ref 26.0–34.0)
MCHC: 33.6 g/dL (ref 30.0–36.0)
MCV: 89.7 fL (ref 80.0–100.0)
Monocytes Absolute: 0.6 K/uL (ref 0.1–1.0)
Monocytes Relative: 6 %
Neutro Abs: 10.2 K/uL — ABNORMAL HIGH (ref 1.7–7.7)
Neutrophils Relative %: 88 %
Platelets: 306 K/uL (ref 150–400)
RBC: 4.28 MIL/uL (ref 3.87–5.11)
RDW: 12.1 % (ref 11.5–15.5)
WBC: 11.7 K/uL — ABNORMAL HIGH (ref 4.0–10.5)
nRBC: 0 % (ref 0.0–0.2)

## 2024-08-05 LAB — URINALYSIS, ROUTINE W REFLEX MICROSCOPIC
Bilirubin Urine: NEGATIVE
Glucose, UA: NEGATIVE mg/dL
Ketones, ur: 20 mg/dL — AB
Nitrite: NEGATIVE
Protein, ur: 30 mg/dL — AB
Specific Gravity, Urine: 1.028 (ref 1.005–1.030)
pH: 6 (ref 5.0–8.0)

## 2024-08-05 LAB — COMPREHENSIVE METABOLIC PANEL WITH GFR
ALT: 15 U/L (ref 0–44)
AST: 17 U/L (ref 15–41)
Albumin: 4.2 g/dL (ref 3.5–5.0)
Alkaline Phosphatase: 43 U/L (ref 38–126)
Anion gap: 9 (ref 5–15)
BUN: 14 mg/dL (ref 6–20)
CO2: 25 mmol/L (ref 22–32)
Calcium: 8.8 mg/dL — ABNORMAL LOW (ref 8.9–10.3)
Chloride: 105 mmol/L (ref 98–111)
Creatinine, Ser: 0.72 mg/dL (ref 0.44–1.00)
GFR, Estimated: >60 mL/min (ref >60)
Glucose, Bld: 101 mg/dL — ABNORMAL HIGH (ref 70–99)
Potassium: 4.1 mmol/L (ref 3.5–5.1)
Sodium: 139 mmol/L (ref 135–145)
Total Bilirubin: 1.1 mg/dL (ref 0.0–1.2)
Total Protein: 7.1 g/dL (ref 6.5–8.1)

## 2024-08-05 LAB — URINE DRUG SCREEN, QUALITATIVE (ARMC ONLY)
Amphetamines, Ur Screen: POSITIVE — AB
Barbiturates, Ur Screen: NOT DETECTED
Benzodiazepine, Ur Scrn: NOT DETECTED
Cannabinoid 50 Ng, Ur ~~LOC~~: POSITIVE — AB
Cocaine Metabolite,Ur ~~LOC~~: NOT DETECTED
MDMA (Ecstasy)Ur Screen: NOT DETECTED
Methadone Scn, Ur: NOT DETECTED
Opiate, Ur Screen: NOT DETECTED
Phencyclidine (PCP) Ur S: NOT DETECTED
Tricyclic, Ur Screen: NOT DETECTED

## 2024-08-05 LAB — PREGNANCY, URINE: Preg Test, Ur: NEGATIVE

## 2024-08-05 MED ORDER — IBUPROFEN 400 MG PO TABS
400.0000 mg | ORAL_TABLET | Freq: Once | ORAL | Status: AC
  Administered 2024-08-05: 400 mg via ORAL
  Filled 2024-08-05: qty 1
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
## 2024-08-12 ENCOUNTER — Other Ambulatory Visit: Payer: Self-pay

## 2024-08-12 ENCOUNTER — Emergency Department
Admission: EM | Admit: 2024-08-12 | Discharge: 2024-08-12 | Disposition: A | Discharge status: Home / Self Care | Attending: Emergency Medicine | Admitting: Emergency Medicine

## 2024-08-12 DIAGNOSIS — R569 Unspecified convulsions: Secondary | ICD-10-CM | POA: Diagnosis present

## 2024-08-12 DIAGNOSIS — F445 Conversion disorder with seizures or convulsions: Secondary | ICD-10-CM | POA: Diagnosis not present

## 2024-08-12 MED ORDER — KETOROLAC TROMETHAMINE 30 MG/ML IJ SOLN
15.0000 mg | Freq: Once | INTRAMUSCULAR | Status: AC
  Administered 2024-08-12: 15 mg via INTRAVENOUS
  Filled 2024-08-12: qty 1

## 2024-08-12 MED ORDER — LACTATED RINGERS IV BOLUS
1000.0000 mL | Freq: Once | INTRAVENOUS | Status: AC
  Administered 2024-08-12: 1000 mL via INTRAVENOUS
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
## 2024-08-12 NOTE — Discharge Instructions (Addendum)
 Please reach out to your neurologist to be seen in the clinic

## 2024-08-12 NOTE — ED Notes (Signed)
 Pt's mother called and informed that pt is ready for discharge. Mother states she will be here in twenty minutes.

## 2024-08-21 ENCOUNTER — Telehealth: Payer: Self-pay | Admitting: Nurse Practitioner

## 2024-09-10 ENCOUNTER — Emergency Department

## 2024-09-10 ENCOUNTER — Encounter: Payer: Self-pay | Admitting: Intensive Care

## 2024-09-10 ENCOUNTER — Other Ambulatory Visit: Payer: Self-pay

## 2024-09-10 ENCOUNTER — Emergency Department: Admission: EM | Admit: 2024-09-10 | Discharge: 2024-09-10 | Disposition: A | Discharge status: Home / Self Care

## 2024-09-10 DIAGNOSIS — M79604 Pain in right leg: Secondary | ICD-10-CM | POA: Diagnosis not present

## 2024-09-10 DIAGNOSIS — R569 Unspecified convulsions: Secondary | ICD-10-CM | POA: Diagnosis present

## 2024-09-10 DIAGNOSIS — M79601 Pain in right arm: Secondary | ICD-10-CM | POA: Diagnosis not present

## 2024-09-10 DIAGNOSIS — M79602 Pain in left arm: Secondary | ICD-10-CM | POA: Diagnosis not present

## 2024-09-10 DIAGNOSIS — R519 Headache, unspecified: Secondary | ICD-10-CM | POA: Diagnosis not present

## 2024-09-10 DIAGNOSIS — M79605 Pain in left leg: Secondary | ICD-10-CM | POA: Insufficient documentation

## 2024-09-10 DIAGNOSIS — R110 Nausea: Secondary | ICD-10-CM | POA: Insufficient documentation

## 2024-09-10 LAB — COMPREHENSIVE METABOLIC PANEL WITH GFR
ALT: 25 U/L (ref 0–44)
AST: 22 U/L (ref 15–41)
Albumin: 5 g/dL (ref 3.5–5.0)
Alkaline Phosphatase: 47 U/L (ref 38–126)
Anion gap: 13 (ref 5–15)
BUN: 17 mg/dL (ref 6–20)
CO2: 21 mmol/L — ABNORMAL LOW (ref 22–32)
Calcium: 9.1 mg/dL (ref 8.9–10.3)
Chloride: 104 mmol/L (ref 98–111)
Creatinine, Ser: 0.69 mg/dL (ref 0.44–1.00)
GFR, Estimated: >60 mL/min (ref >60)
Glucose, Bld: 123 mg/dL — ABNORMAL HIGH (ref 70–99)
Potassium: 4 mmol/L (ref 3.5–5.1)
Sodium: 138 mmol/L (ref 135–145)
Total Bilirubin: 1.2 mg/dL (ref 0.0–1.2)
Total Protein: 8.1 g/dL (ref 6.5–8.1)

## 2024-09-10 LAB — URINALYSIS, COMPLETE (UACMP) WITH MICROSCOPIC
Bacteria, UA: NONE SEEN
Bilirubin Urine: NEGATIVE
Glucose, UA: NEGATIVE mg/dL
Ketones, ur: 20 mg/dL — AB
Leukocytes,Ua: NEGATIVE
Nitrite: NEGATIVE
Protein, ur: NEGATIVE mg/dL
Specific Gravity, Urine: 1.021 (ref 1.005–1.030)
pH: 5 (ref 5.0–8.0)

## 2024-09-10 LAB — URINE DRUG SCREEN, QUALITATIVE (ARMC ONLY)
Amphetamines, Ur Screen: NOT DETECTED
Barbiturates, Ur Screen: NOT DETECTED
Benzodiazepine, Ur Scrn: NOT DETECTED
Cannabinoid 50 Ng, Ur ~~LOC~~: POSITIVE — AB
Cocaine Metabolite,Ur ~~LOC~~: NOT DETECTED
MDMA (Ecstasy)Ur Screen: NOT DETECTED
Methadone Scn, Ur: NOT DETECTED
Opiate, Ur Screen: NOT DETECTED
Phencyclidine (PCP) Ur S: NOT DETECTED
Tricyclic, Ur Screen: NOT DETECTED

## 2024-09-10 LAB — CBC WITH DIFFERENTIAL/PLATELET
Abs Immature Granulocytes: 0.1 K/uL — ABNORMAL HIGH (ref 0.00–0.07)
Basophils Absolute: 0 K/uL (ref 0.0–0.1)
Basophils Relative: 0 %
Eosinophils Absolute: 0 K/uL (ref 0.0–0.5)
Eosinophils Relative: 0 %
HCT: 39.6 % (ref 36.0–46.0)
Hemoglobin: 13 g/dL (ref 12.0–15.0)
Immature Granulocytes: 1 %
Lymphocytes Relative: 2 %
Lymphs Abs: 0.4 K/uL — ABNORMAL LOW (ref 0.7–4.0)
MCH: 30.5 pg (ref 26.0–34.0)
MCHC: 32.8 g/dL (ref 30.0–36.0)
MCV: 93 fL (ref 80.0–100.0)
Monocytes Absolute: 0.7 K/uL (ref 0.1–1.0)
Monocytes Relative: 3 %
Neutro Abs: 19.8 K/uL — ABNORMAL HIGH (ref 1.7–7.7)
Neutrophils Relative %: 94 %
Platelets: 317 K/uL (ref 150–400)
RBC: 4.26 MIL/uL (ref 3.87–5.11)
RDW: 12.4 % (ref 11.5–15.5)
WBC: 21 K/uL — ABNORMAL HIGH (ref 4.0–10.5)
nRBC: 0 % (ref 0.0–0.2)

## 2024-09-10 LAB — RESP PANEL BY RT-PCR (RSV, FLU A&B, COVID)  RVPGX2
Influenza A by PCR: NEGATIVE
Influenza B by PCR: NEGATIVE
Resp Syncytial Virus by PCR: NEGATIVE
SARS Coronavirus 2 by RT PCR: NEGATIVE

## 2024-09-10 LAB — POC URINE PREG, ED: Preg Test, Ur: NEGATIVE

## 2024-09-10 LAB — MAGNESIUM: Magnesium: 2 mg/dL (ref 1.7–2.4)

## 2024-09-10 MED ORDER — LORAZEPAM 2 MG/ML IJ SOLN
0.5000 mg | Freq: Once | INTRAMUSCULAR | Status: AC
  Administered 2024-09-10: 0.5 mg via INTRAVENOUS
  Filled 2024-09-10: qty 1

## 2024-09-10 MED ORDER — DROPERIDOL 2.5 MG/ML IJ SOLN
1.2500 mg | Freq: Once | INTRAMUSCULAR | Status: AC
  Administered 2024-09-10: 1.25 mg via INTRAVENOUS
  Filled 2024-09-10: qty 2

## 2024-09-10 MED ORDER — SODIUM CHLORIDE 0.9 % IV BOLUS
1000.0000 mL | Freq: Once | INTRAVENOUS | Status: AC
  Administered 2024-09-10: 1000 mL via INTRAVENOUS

## 2024-09-10 MED ORDER — ONDANSETRON HCL 4 MG/2ML IJ SOLN
4.0000 mg | Freq: Once | INTRAMUSCULAR | Status: AC
  Administered 2024-09-10: 4 mg via INTRAVENOUS
  Filled 2024-09-10: qty 2

## 2024-09-10 MED ORDER — ACETAMINOPHEN 10 MG/ML IV SOLN
1000.0000 mg | Freq: Once | INTRAVENOUS | Status: AC
  Administered 2024-09-10: 1000 mg via INTRAVENOUS
  Filled 2024-09-10: qty 100
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
## 2024-09-10 NOTE — ED Triage Notes (Addendum)
 First nurse note: pt to ED [facility] from home for 2 seizures, emesis, alert and oriented. Pt does not take any seizure meds. Received 4mg  zofran  PTA

## 2024-09-10 NOTE — ED Provider Notes (Signed)
-----------------------------------------   8:35 PM on 09/10/2024 -----------------------------------------  Blood pressure 101/61, pulse 98, temperature 99.4 F (37.4 C), temperature source Oral, resp. rate 16, height 5' 5 (1.651 m), weight 44.9 kg, SpO2 100%.  Assuming care from Dr. Nicholaus.  In short, Patricia Guerra is a 33 y.o. adult with a chief complaint of Seizures .  Refer to the original H&P for additional details.  The current plan of care is to await results of urinalysis and reassess with likely plan of discharge home to follow up with neurology.    Clinical Course as of 09/10/24 2327  Fri Sep 10, 2024  1941 DG Chest 2 View No acute abnormality [HD]  1948 Patient has improved mentation, reports improvement with IV fluids, able to answer questions [HD]  2012 Patient requesting something to drink and provided water [HD]  2036 Patient again with improved and mentation but still nauseous.  Abdominal exam completely benign.  Will add on some droperidol and Ativan  and plan to sign patient out to oncoming provider [HD]  2316 Urinalysis without concern for infection. Specimen collected prior to IV fluids which have likely corrected the presence of ketones. UDS shows cannabinoid, otherwise negative.  Patient advised not to drive, climb, or operate machinery until evaluated and cleared by neurology. Patient is agreeable to plan for discharge. [CT]    Clinical Course User Index [CT] Herlinda Kirk NOVAK, FNP [HD] Nicholaus Rolland BRAVO, MD      Herlinda Kirk NOVAK, FNP 09/10/24 7672    Nicholaus Rolland BRAVO, MD 09/11/24 [phone]

## 2024-09-10 NOTE — Discharge Instructions (Signed)
 You were seen in the emergency department for seizure-like activity.  Workup today was unremarkable except for an elevated white blood cell count which I think may be reactive.  Please call [facility] neurology and see whether they can see you more urgently than February.  If they cannot I have placed an internal referral in our system.  Please do not drive or operate any machinery until you are cleared by a neurologist.  Ensure adequate hydration.  Attempt to avoid any stressors.  Return if any acutely worsening symptoms or any other emergency -- RETURN PRECAUTIONS & AFTERCARE: (ENGLISH) RETURN PRECAUTIONS: Return immediately to the emergency department or see/call your doctor if you feel worse, weak or have changes in speech or vision, are short of breath, have fever, vomiting, pain, bleeding or dark stool, trouble urinating or any new issues. Return here or see/call your doctor if not improving as expected for your suspected condition. FOLLOW-UP CARE: Call your doctor and/or any doctors we referred you to for more advice and to make an appointment. Do this today, tomorrow or after the weekend. Some doctors only take PPO insurance so if you have HMO insurance you may want to contact your HMO or your regular doctor for referral to a specialist within your plan. Either way tell the doctor's office that it was a referral from the emergency department so you get the soonest possible appointment.  YOUR TEST RESULTS: Take result reports of any blood or urine tests, imaging tests and EKG's to your doctor and any referral doctor. Have any abnormal tests repeated. Your doctor or a referral doctor can let you know when this should be done. Also make sure your doctor contacts this hospital to get any test results that are not currently available such as cultures or special tests for infection and final imaging reports, which are often not available at the time you leave the ER but which may list additional important findings  that are not documented on the preliminary report. BLOOD PRESSURE: If your blood pressure was greater than 120/80 have your blood pressure rechecked within 1 to 2 weeks. MEDICATION SIDE EFFECTS: Do not drive, walk, bike, take the bus, etc. if you have received or are being prescribed any sedating medications such as those for pain or anxiety or certain antihistamines like Benadryl. If you have been give one of these here get a taxi home or have a friend drive you home. Ask your pharmacist to counsel you on potential side effects of any new medication

## 2024-09-10 NOTE — ED Triage Notes (Addendum)
 Arrived by [facility] from home for seizure. Patient reports she was asleep in bed during seizure and woke up on floor. Reports she hurts all over. Describes head pain as throbbing  Has been nauseas. EMS administered 4mg  zofran    Patient A&O x4 in triage   Reports she used to take keppra  but stopped around a year ago

## 2024-09-16 DIAGNOSIS — F1520 Other stimulant dependence, uncomplicated: Secondary | ICD-10-CM | POA: Diagnosis present

## 2024-09-16 DIAGNOSIS — F1211 Cannabis abuse, in remission: Secondary | ICD-10-CM | POA: Diagnosis present

## 2024-11-26 ENCOUNTER — Other Ambulatory Visit: Payer: Self-pay

## 2024-11-26 ENCOUNTER — Emergency Department

## 2024-11-26 ENCOUNTER — Inpatient Hospital Stay
Admission: EM | Admit: 2024-11-26 | Discharge: 2024-12-01 | DRG: 100 | Disposition: A | Discharge status: Home / Self Care | Attending: Internal Medicine | Admitting: Internal Medicine

## 2024-11-26 ENCOUNTER — Observation Stay

## 2024-11-26 DIAGNOSIS — N3001 Acute cystitis with hematuria: Secondary | ICD-10-CM | POA: Diagnosis not present

## 2024-11-26 DIAGNOSIS — N390 Urinary tract infection, site not specified: Secondary | ICD-10-CM | POA: Diagnosis not present

## 2024-11-26 DIAGNOSIS — Z23 Encounter for immunization: Secondary | ICD-10-CM

## 2024-11-26 DIAGNOSIS — E878 Other disorders of electrolyte and fluid balance, not elsewhere classified: Secondary | ICD-10-CM | POA: Insufficient documentation

## 2024-11-26 DIAGNOSIS — F17210 Nicotine dependence, cigarettes, uncomplicated: Secondary | ICD-10-CM | POA: Diagnosis present

## 2024-11-26 DIAGNOSIS — F419 Anxiety disorder, unspecified: Secondary | ICD-10-CM | POA: Diagnosis present

## 2024-11-26 DIAGNOSIS — Z91148 Patient's other noncompliance with medication regimen for other reason: Secondary | ICD-10-CM

## 2024-11-26 DIAGNOSIS — R569 Unspecified convulsions: Secondary | ICD-10-CM | POA: Diagnosis not present

## 2024-11-26 DIAGNOSIS — R4182 Altered mental status, unspecified: Principal | ICD-10-CM

## 2024-11-26 DIAGNOSIS — E43 Unspecified severe protein-calorie malnutrition: Secondary | ICD-10-CM | POA: Diagnosis present

## 2024-11-26 DIAGNOSIS — Z79899 Other long term (current) drug therapy: Secondary | ICD-10-CM

## 2024-11-26 DIAGNOSIS — G929 Unspecified toxic encephalopathy: Secondary | ICD-10-CM | POA: Diagnosis not present

## 2024-11-26 DIAGNOSIS — G9340 Encephalopathy, unspecified: Secondary | ICD-10-CM | POA: Diagnosis not present

## 2024-11-26 DIAGNOSIS — F32A Depression, unspecified: Secondary | ICD-10-CM | POA: Diagnosis not present

## 2024-11-26 DIAGNOSIS — E860 Dehydration: Secondary | ICD-10-CM | POA: Diagnosis present

## 2024-11-26 DIAGNOSIS — F640 Transsexualism: Secondary | ICD-10-CM | POA: Diagnosis present

## 2024-11-26 DIAGNOSIS — Z880 Allergy status to penicillin: Secondary | ICD-10-CM

## 2024-11-26 DIAGNOSIS — K529 Noninfective gastroenteritis and colitis, unspecified: Secondary | ICD-10-CM | POA: Diagnosis present

## 2024-11-26 DIAGNOSIS — E872 Acidosis, unspecified: Secondary | ICD-10-CM | POA: Diagnosis present

## 2024-11-26 DIAGNOSIS — G40909 Epilepsy, unspecified, not intractable, without status epilepticus: Principal | ICD-10-CM | POA: Diagnosis present

## 2024-11-26 DIAGNOSIS — F1520 Other stimulant dependence, uncomplicated: Secondary | ICD-10-CM | POA: Diagnosis present

## 2024-11-26 DIAGNOSIS — F1211 Cannabis abuse, in remission: Secondary | ICD-10-CM | POA: Diagnosis present

## 2024-11-26 DIAGNOSIS — A419 Sepsis, unspecified organism: Secondary | ICD-10-CM

## 2024-11-26 DIAGNOSIS — F329 Major depressive disorder, single episode, unspecified: Secondary | ICD-10-CM | POA: Diagnosis present

## 2024-11-26 DIAGNOSIS — N12 Tubulo-interstitial nephritis, not specified as acute or chronic: Secondary | ICD-10-CM | POA: Diagnosis not present

## 2024-11-26 DIAGNOSIS — N10 Acute tubulo-interstitial nephritis: Secondary | ICD-10-CM | POA: Diagnosis present

## 2024-11-26 DIAGNOSIS — M79601 Pain in right arm: Secondary | ICD-10-CM | POA: Diagnosis present

## 2024-11-26 DIAGNOSIS — E162 Hypoglycemia, unspecified: Secondary | ICD-10-CM | POA: Diagnosis present

## 2024-11-26 DIAGNOSIS — Z681 Body mass index (BMI) 19 or less, adult: Secondary | ICD-10-CM

## 2024-11-26 DIAGNOSIS — H109 Unspecified conjunctivitis: Secondary | ICD-10-CM | POA: Diagnosis present

## 2024-11-26 DIAGNOSIS — Z789 Other specified health status: Secondary | ICD-10-CM | POA: Diagnosis present

## 2024-11-26 DIAGNOSIS — E876 Hypokalemia: Secondary | ICD-10-CM | POA: Diagnosis present

## 2024-11-26 LAB — LACTIC ACID, PLASMA
Lactic Acid, Venous: 0.9 mmol/L (ref 0.5–1.9)
Lactic Acid, Venous: 0.7 mmol/L (ref 0.5–1.9)

## 2024-11-26 LAB — URINE DRUG SCREEN
Amphetamines: NEGATIVE
Barbiturates: NEGATIVE
Benzodiazepines: NEGATIVE
Cocaine: NEGATIVE
Fentanyl: NEGATIVE
Methadone Scn, Ur: NEGATIVE
Opiates: NEGATIVE
Tetrahydrocannabinol: POSITIVE — AB

## 2024-11-26 LAB — COMPREHENSIVE METABOLIC PANEL WITH GFR
ALT: 12 U/L (ref 0–44)
ALT: 7 U/L (ref 0–44)
AST: 19 U/L (ref 15–41)
AST: 12 U/L — ABNORMAL LOW (ref 15–41)
Albumin: 5.1 g/dL — ABNORMAL HIGH (ref 3.5–5.0)
Albumin: 4.1 g/dL (ref 3.5–5.0)
Alkaline Phosphatase: 55 U/L (ref 38–126)
Alkaline Phosphatase: 41 U/L (ref 38–126)
Anion gap: 23 — ABNORMAL HIGH (ref 5–15)
Anion gap: 11 (ref 5–15)
BUN: 16 mg/dL (ref 6–20)
BUN: 11 mg/dL (ref 6–20)
CO2: 16 mmol/L — ABNORMAL LOW (ref 22–32)
CO2: 20 mmol/L — ABNORMAL LOW (ref 22–32)
Calcium: 9.8 mg/dL (ref 8.9–10.3)
Calcium: 8.5 mg/dL — ABNORMAL LOW (ref 8.9–10.3)
Chloride: 102 mmol/L (ref 98–111)
Chloride: 107 mmol/L (ref 98–111)
Creatinine, Ser: 1 mg/dL (ref 0.44–1.00)
Creatinine, Ser: 0.64 mg/dL (ref 0.44–1.00)
GFR, Estimated: >60 mL/min
GFR, Estimated: >60 mL/min
Glucose, Bld: 150 mg/dL — ABNORMAL HIGH (ref 70–99)
Glucose, Bld: 98 mg/dL (ref 70–99)
Potassium: 3.7 mmol/L (ref 3.5–5.1)
Potassium: 3.6 mmol/L (ref 3.5–5.1)
Sodium: 140 mmol/L (ref 135–145)
Sodium: 138 mmol/L (ref 135–145)
Total Bilirubin: 0.6 mg/dL (ref 0.0–1.2)
Total Bilirubin: 0.6 mg/dL (ref 0.0–1.2)
Total Protein: 8.2 g/dL — ABNORMAL HIGH (ref 6.5–8.1)
Total Protein: 6.3 g/dL — ABNORMAL LOW (ref 6.5–8.1)

## 2024-11-26 LAB — BLOOD GAS, VENOUS
Acid-base deficit: 1.9 mmol/L (ref 0.0–2.0)
Bicarbonate: 22.2 mmol/L (ref 20.0–28.0)
O2 Saturation: 96.3 %
Patient temperature: 37
pCO2, Ven: 35 mmHg — ABNORMAL LOW (ref 44–60)
pH, Ven: 7.41 (ref 7.25–7.43)
pO2, Ven: 67 mmHg — ABNORMAL HIGH (ref 32–45)

## 2024-11-26 LAB — URINALYSIS, COMPLETE (UACMP) WITH MICROSCOPIC
Bilirubin Urine: NEGATIVE
Glucose, UA: NEGATIVE mg/dL
Ketones, ur: 20 mg/dL — AB
Leukocytes,Ua: NEGATIVE
Nitrite: NEGATIVE
Protein, ur: 30 mg/dL — AB
Specific Gravity, Urine: 1.023 (ref 1.005–1.030)
WBC, UA: >50 WBC/hpf (ref 0–5)
pH: 5 (ref 5.0–8.0)

## 2024-11-26 LAB — HIV ANTIBODY (ROUTINE TESTING W REFLEX): HIV Screen 4th Generation wRfx: NONREACTIVE

## 2024-11-26 LAB — CBC WITH DIFFERENTIAL/PLATELET
Abs Immature Granulocytes: 0.1 K/uL — ABNORMAL HIGH (ref 0.00–0.07)
Basophils Absolute: 0 K/uL (ref 0.0–0.1)
Basophils Relative: 0 %
Eosinophils Absolute: 0 K/uL (ref 0.0–0.5)
Eosinophils Relative: 0 %
HCT: 41.8 % (ref 36.0–46.0)
Hemoglobin: 13.7 g/dL (ref 12.0–15.0)
Immature Granulocytes: 1 %
Lymphocytes Relative: 5 %
Lymphs Abs: 1.1 K/uL (ref 0.7–4.0)
MCH: 30.2 pg (ref 26.0–34.0)
MCHC: 32.8 g/dL (ref 30.0–36.0)
MCV: 92.3 fL (ref 80.0–100.0)
Monocytes Absolute: 0.9 K/uL (ref 0.1–1.0)
Monocytes Relative: 4 %
Neutro Abs: 20 K/uL — ABNORMAL HIGH (ref 1.7–7.7)
Neutrophils Relative %: 90 %
Platelets: 361 K/uL (ref 150–400)
RBC: 4.53 MIL/uL (ref 3.87–5.11)
RDW: 11.9 % (ref 11.5–15.5)
WBC: 22.2 K/uL — ABNORMAL HIGH (ref 4.0–10.5)
nRBC: 0 % (ref 0.0–0.2)

## 2024-11-26 LAB — BETA-HYDROXYBUTYRIC ACID: Beta-Hydroxybutyric Acid: 1.97 mmol/L — ABNORMAL HIGH (ref 0.05–0.27)

## 2024-11-26 LAB — ETHANOL: Alcohol, Ethyl (B): <15 mg/dL

## 2024-11-26 LAB — ACETAMINOPHEN LEVEL: Acetaminophen (Tylenol), Serum: <10 ug/mL — ABNORMAL LOW (ref 10–30)

## 2024-11-26 LAB — HCG, QUANTITATIVE, PREGNANCY: hCG, Beta Chain, Quant, S: <1 m[IU]/mL

## 2024-11-26 LAB — SALICYLATE LEVEL: Salicylate Lvl: <7 mg/dL — ABNORMAL LOW (ref 7.0–30.0)

## 2024-11-26 MED ORDER — LEVETIRACETAM (KEPPRA) 500 MG/5 ML ADULT IV PUSH
500.0000 mg | Freq: Two times a day (BID) | INTRAVENOUS | Status: DC
  Administered 2024-11-26 – 2024-11-28 (×4): 500 mg via INTRAVENOUS
  Filled 2024-11-26 (×4): qty 5

## 2024-11-26 MED ORDER — ACETAMINOPHEN 650 MG RE SUPP: 650.0000 mg | Freq: Four times a day (QID) | RECTAL | Status: DC | PRN

## 2024-11-26 MED ORDER — LORAZEPAM 2 MG/ML IJ SOLN
1.0000 mg | INTRAMUSCULAR | Status: DC | PRN
  Administered 2024-11-28: 1 mg via INTRAVENOUS
  Filled 2024-11-26: qty 1

## 2024-11-26 MED ORDER — SODIUM CHLORIDE 0.9% FLUSH
3.0000 mL | Freq: Two times a day (BID) | INTRAVENOUS | Status: DC
  Administered 2024-11-26 – 2024-12-01 (×8): 3 mL via INTRAVENOUS

## 2024-11-26 MED ORDER — POLYETHYLENE GLYCOL 3350 17 G PO PACK: 17.0000 g | PACK | Freq: Every day | ORAL | Status: DC | PRN

## 2024-11-26 MED ORDER — LEVOFLOXACIN IN D5W 750 MG/150ML IV SOLN: 750.0000 mg | INTRAVENOUS | Status: DC

## 2024-11-26 MED ORDER — LEVETIRACETAM (KEPPRA) 500 MG/5 ML ADULT IV PUSH
1000.0000 mg | Freq: Once | INTRAVENOUS | Status: AC
  Administered 2024-11-26: 1000 mg via INTRAVENOUS
  Filled 2024-11-26: qty 10

## 2024-11-26 MED ORDER — SODIUM CHLORIDE 0.9 % IV BOLUS
1000.0000 mL | Freq: Once | INTRAVENOUS | Status: AC
  Administered 2024-11-26: 1000 mL via INTRAVENOUS

## 2024-11-26 MED ORDER — LORAZEPAM 2 MG/ML IJ SOLN: 1.0000 mg | INTRAMUSCULAR | Status: DC | PRN

## 2024-11-26 MED ORDER — ACETAMINOPHEN 325 MG PO TABS
650.0000 mg | ORAL_TABLET | Freq: Four times a day (QID) | ORAL | Status: DC | PRN
  Administered 2024-11-27 – 2024-12-01 (×4): 650 mg via ORAL
  Filled 2024-11-26 (×4): qty 2

## 2024-11-26 MED ORDER — ENOXAPARIN SODIUM 30 MG/0.3ML IJ SOSY
30.0000 mg | PREFILLED_SYRINGE | INTRAMUSCULAR | Status: DC
  Administered 2024-11-26 – 2024-11-30 (×5): 30 mg via SUBCUTANEOUS
  Filled 2024-11-26 (×5): qty 0.3

## 2024-11-26 MED ORDER — LACTATED RINGERS IV SOLN: INTRAVENOUS | Status: DC

## 2024-11-26 MED ORDER — LEVOFLOXACIN IN D5W 750 MG/150ML IV SOLN
750.0000 mg | Freq: Once | INTRAVENOUS | Status: AC
  Administered 2024-11-26: 750 mg via INTRAVENOUS
  Filled 2024-11-26: qty 150

## 2024-11-26 NOTE — Progress Notes (Signed)
 PHARMACIST - PHYSICIAN COMMUNICATION  CONCERNING:  Enoxaparin  (Lovenox ) for DVT Prophylaxis    RECOMMENDATION: Patient was prescribed enoxaprin 40mg  q24 hours for VTE prophylaxis.   Filed Weights   11/26/24 1119  Weight: 41.7 kg (92 lb)    Body mass index is 15.31 kg/m.  Estimated Creatinine Clearance (by C-G formula based on SCr of 1 mg/dL) Female: 46.7 mL/min Female: 62.6 mL/min   Patient is candidate for enoxaparin  30mg  every 24 hours based on CrCl <41ml/min or Weight <45kg  DESCRIPTION: Pharmacy has adjusted enoxaparin  dose per [facility] policy.  Patient is now receiving enoxaparin  30 mg every 24 hours    Annabella LOISE Banks, PharmD Clinical Pharmacist  11/26/2024 1:27 PM

## 2024-11-26 NOTE — Consult Note (Addendum)
 NEUROLOGY CONSULT NOTE   Date of service: November 26, 2024 Patient Name: Patricia Guerra MRN:  [id number] DOB:  [birth year] Chief Complaint: AMS and seizures x 2 Requesting Provider: Seena Marsa NOVAK, MD  History of Present Illness  Patricia Guerra is a 34 y.o. 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 EMS reported that the patient did use Nyquil yesterday for cold symptoms. He was altered on arrival and responsive to painful stimuli. It was unknown if there was any definite recent illicit drug use, but it was conveyed to EDP that the patient had possibly been using unspecified substances yesterday throughout the evening. The patient was noted to be drowsy around 4:30 AM. Mother left and returned home and found patient minimally responsive, so she called an ambulance. Mother thought patient may have had a seizure as he looked postictal to her. No witnessed seizures prior to EMS arrival. En route to the hospital the patient did have a generalized tonic-clonic seizure lasting about 30 seconds. He received 2 mg IV Versed. Per EMS,  he was following some basic commands though not answering questions. They did not appreciate any focal deficits. Glucose was normal.   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  Labs in the ED revealed an elevated WBC of 22. U/A was with pyuria concerning for a UTI. EtOH < 15. Bicarbonate was low. Labs also with evidence of dehydration. Axillary temp was 99.7. HR in the 90-110's range. Initial BP 115/76. Initial lactate was normal. UDS was positive for THC. ASA and APAP were negative. EDP suspects that  UTI or pyelonephritis has lowered the patient's seizure threshold. The patient has been started on levofloxacin . He was also loaded with Keppra  1000 mg IV.   At about 3 PM, the patient had a breakthrough seizure lasting for approximately 30 seconds. Per RN note: Patient just had another seizure - only about 25 sec. Shaking mainly head and chest, Diaphoretic, nauseous and reported difficulty seeing. Did not loose consciousness. However, patient did bite the tip of his tongue.  Hosp informed and consulted Neuro.  Ativan  another dose of keppra  ordered. A second loading dose of 1000 mg IV Keppra  was administered. Scheduled Keppra  dosing at 500 mg BID was ordered. The patient then reported blurry vision, right greater than left.     ROS  Unable to obtain at the time of Neurology evaluation due to somnolence, confusion and memory deficit.   Past History   Past Medical History:  Diagnosis Date   Seizures (HCC)     History reviewed. No pertinent surgical history.  Family History: History reviewed. No pertinent family history.  Social History  reports that he has been smoking cigarettes. He has never used smokeless tobacco. He reports that he does not currently use drugs after having used the following drugs: Marijuana. He reports that he does not drink alcohol.  Allergies[1]  Medications  Current Medications[2]  Vitals   Vitals:   11/26/24 1207 11/26/24 1230 11/26/24 1300 11/26/24 1330  BP:  117/72 110/75 115/76  Pulse: 93 96 (!) 116 99  Resp:  15 19 14   Temp:      SpO2:  97% 100% 99% 100%  Weight:      Height:        Body mass index is 15.31 kg/m.   Physical Exam   Constitutional: Appears well-developed and well-nourished.  Psych: Lethargic. Will inappropriately grin and laugh softly to some questions when being asked about his medical history.  Eyes: No scleral injection.  HENT: No OP obstruction.  Head: Normocephalic. No neck stiffness.  Respiratory: Effort normal,  non-labored breathing.  GI:   No distension. Skin: WDI.   Neurologic Examination   Mental Status: Lethargic to severely somnolent with speech slurred in this context .Speech is sparse but fluent. Unable to answer any orientation questions, even when given a multiple choice format. Was not able to state where he was or why he is in the hospital. Did endorse feeling ill overall. Was not able to participate in naming of objects but could count two fingers held in front of him. Requires constant stimulation to attend. Able to follow simple motor commands.  Cranial Nerves: II: Difficulty keeping eyes open. Able to count two fingers. Unreliable responses to visual threat.  III,IV, VI: Bilateral ptotic eyelids that open wider to noxious and verbal stimuli. Eyes are near the midline with some roving EOM that are conjugate. No nystagmus. V: Reacts to eyelid stimulation bilaterally VII: Smile symmetric when inappropriately laughing.  VIII: Hearing intact to voice IX,X: No hypophonia or hoarseness XI: Symmetric XII: Midline tongue extension to command.  Motor: RUE: 4/5 LUE: 4/5 RLE: 4/5 LLE: 4/5 Poor effort with motor testing. No asymmetry noted.  No myoclonus or asterixis. No tremor. No rigidity.  Sensory: Reacts to touch x 4 and able to state he feels the stimul. Deep Tendon Reflexes: 2+ and symmetric bilateral biceps, brachioradialis and patellae. No hyperreflexia.  Plantars: Right: downgoingLeft: downgoing Cerebellar: No ataxia with FNF bilaterally, but significantly slowed.  Gait: Deferred  Labs/Imaging/Neurodiagnostic studies   CBC:  Recent Labs  Lab 12/01/24 1110  WBC 22.2*  NEUTROABS 20.0*  HGB 13.7  HCT 41.8  MCV 92.3  PLT 361   Basic Metabolic Panel:  Lab Results  Component Value Date   NA 140 December 01, 2024   K 3.7 2024/12/01   CO2 16 (L) December 01, 2024   GLUCOSE 150 (H) 12-01-24   BUN 16 12-01-24   CREATININE 1.00 12/01/2024   CALCIUM 9.8 12-01-24   GFRNONAA >60  Dec 01, 2024   GFRAA >60 10/07/2017   Lipid Panel: No results found for: LDLCALC HgbA1c: No results found for: HGBA1C Urine Drug Screen:     Component Value Date/Time   LABOPIA NEGATIVE 2024-12-01 1124   COCAINSCRNUR NEGATIVE 2024-12-01 1124   COCAINSCRNUR NONE DETECTED 09/10/2024 2038   LABBENZ NEGATIVE 12/01/2024 1124   AMPHETMU NEGATIVE Dec 01, 2024 1124   THCU POSITIVE (A) Dec 01, 2024 1124   LABBARB NEGATIVE Dec 01, 2024 1124    Alcohol Level     Component Value Date/Time   [facility] <15 December 01, 2024 1110     ASSESSMENT  Patricia Guerra is a 34 y.o. 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 He had one seizure en route that lasted for about 30 seconds. He was loaded with Keppra  1000 mg IV in the ED. At 3 PM he had another seizure, lasting again for about 30 seconds. Throughout this period he has been delirious, with  lethargy and somnolence. Mother suspects substance use at home overnight.  - Exam reveals a lethargic patient with disorientation but intact speech fluency and basic comprehension of motor commands. Overall exam findings appear most consistent with a toxic encephalopathy. The patient complains of visual blurring but no headache.   - Labs as outlined in HPI.  - CT head: Examination is limited by suboptimal angulation and subsequent artifact. Within this limitation, no acute intracranial hemorrhage or mass effect is seen. No calvarial fracture. - EEG has been preliminarily reviewed. There is diffuse slowing without asymmetry. No electrographic seizures seen.  - MRI brain: No acute findings.  - Impression:  - Acute onset of AMS. Overall exam findings appear most consistent with a toxic encephalopathy. The patient complains of visual blurring but no  headache. UTI versus pyelonephritis per U/A may be contributing. Serotonin syndrome is also a consideration.  - Seizures versus pseudoseizures. More likely the events that the patient had were seizures, given tongue-biting as well as a possibly lowered seizure threshold given mother's suspicion of substance abuse overnight.  - Past history of seizures. Has not been compliant with Keppra , per patient, but patient unable to state for how long.  - Axillary temperature is borderline elevated. However, absence of neck stiffness or fever, in conjunction with abrupt onset do not militate in favor of a meningitis  RECOMMENDATIONS  - Supportive care per primary team - IV hydration - Continue Keppra  at 500 mg IV BID.  - Avoid serotonergic medications ______________________________________________________________________    Bonney SHARK, Raeya Merritts, MD [facility]     [1]  Allergies Allergen Reactions   Penicillins Anaphylaxis and Swelling    Has patient had a PCN reaction causing immediate rash, facial/tongue/throat swelling, SOB or lightheadedness with hypotension: Yes Has patient had a PCN reaction causing severe rash involving mucus membranes or skin necrosis: No Has patient had a PCN reaction that required hospitalization: Yes, was already in the hospital Has patient had a PCN reaction occurring within the last 10 years: No If all of the above answers are NO, then may proceed with Cephalosporin use.  [2]  Current Facility-Administered Medications:    acetaminophen  (TYLENOL ) tablet 650 mg, 650 mg, Oral, Q6H PRN **OR** acetaminophen  (TYLENOL ) suppository 650 mg, 650 mg, Rectal, Q6H PRN, Melvin, Alexander B, MD   enoxaparin  (LOVENOX ) injection 30 mg, 30 mg, Subcutaneous, Q24H, Melvin, Alexander B, MD   lactated ringers  infusion, , Intravenous, Continuous, Melvin, Alexander B, MD, Last Rate: 100 mL/hr at 11/26/24 1407, New Bag at 11/26/24 1407   levETIRAcetam  (KEPPRA ) undiluted  injection 500 mg, 500 mg, Intravenous, Q12H, Melvin, Alexander B, MD   [START ON 11/27/2024] levofloxacin  (LEVAQUIN ) IVPB 750 mg, 750 mg, Intravenous, Q24H, Melvin, Alexander B, MD   LORazepam  (ATIVAN ) injection 1 mg, 1 mg, Intravenous, Q5 Min x 2 PRN, Melvin, Alexander B, MD   polyethylene glycol (MIRALAX  / GLYCOLAX ) packet 17 g, 17 g, Oral, Daily PRN, Melvin, Alexander B, MD   sodium chloride  flush (NS) 0.9 % injection 3 mL, 3 mL, Intravenous, Q12H, Melvin, Alexander B, MD  Current Outpatient Medications:    hydrOXYzine  (ATARAX ) 25 MG tablet, Take 25 mg by mouth 2 (two) times daily as needed for anxiety., Disp: , Rfl:    ibuprofen  (ADVIL ,MOTRIN ) 200 MG tablet, Take 600-800 mg by mouth every 4 (four) hours as needed for fever, headache, mild pain, moderate pain or cramping., Disp: , Rfl:    OLANZapine  (ZYPREXA ) 10 MG tablet, Take 5 mg by mouth in the morning and  at bedtime., Disp: , Rfl:    clonazePAM (KLONOPIN) 0.5 MG tablet, Take 0.125 mg by mouth daily. (Patient not taking: Reported on 11/26/2024), Disp: , Rfl:    levETIRAcetam  (KEPPRA ) 500 MG tablet, Take 1 tablet (500 mg total) by mouth 2 (two) times daily. (Patient not taking: Reported on 11/26/2024), Disp: 60 tablet, Rfl: 0   levETIRAcetam  (KEPPRA ) 500 MG tablet, Take 1 tablet (500 mg total) by mouth 2 (two) times daily. (Patient not taking: Reported on 11/26/2024), Disp: 60 tablet, Rfl: 1

## 2024-11-26 NOTE — ED Triage Notes (Signed)
 Pt to ED from home via [facility]. Mother called out for AMS. Pt has seizure hx and substance abuse hx. Unknown if any drug use today. EMS reports pt did use Nyquil yesterday fro cold symptoms. Pt altered on arrival and responsive to painful stimuli.

## 2024-11-26 NOTE — ED Provider Notes (Addendum)
"  [facility] Provider Note    Event Date/Time   First MD Initiated Contact with Patient 11/26/24 1106     (approximate)   History   Seizures and Altered Mental Status  Pt to ED from home via [facility]. Mother called out for AMS. Pt has seizure hx and substance abuse hx. Unknown if any drug use today. EMS reports pt did use Nyquil yesterday fro cold symptoms. Pt altered on arrival and responsive to painful stimuli.    HPI Patricia Guerra is a 34 y.o. 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  Per EMS, mother called them due to altered mental status.  States patient had possibly been using unspecified substances yesterday throughout the evening, patient was drowsy around 4:30 AM.  Mother left and returned home and found patient minimally responsive so called an ambulance.  Mother thought patient may have had a seizure as looked postictal to her.  No witnessed seizures prior to EMS arrival no and route to hospital patient did have a generalized tonic-clonic seizure lasting about 30 seconds, received 2 mg IV Versed.  Has been following some basic commands though not answering questions.  They did not appreciate any focal deficits.  Glucose normal.  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  Last CT head on my chart review 09/06/2024, unremarkable.     Physical Exam   Triage Vital Signs: BP 115/76   Pulse 99   Temp 99.3 F (37.4 C)   Resp 14   Ht 5' 5 (1.651 m)   Wt 41.7 kg   SpO2 100%   BMI 15.31 kg/m     Most recent vital signs: Vitals:   11/26/24 1300 11/26/24 1330  BP: 110/75  115/76  Pulse: (!) 116 99  Resp: 19 14  Temp:    SpO2: 99% 100%     General: Awake, no distress.  Protecting airway. HEENT: Normocephalic, atraumatic, PERRL CV:  Good peripheral perfusion.  Tachycardic, regular rhythm, RP 2+ Resp:  Normal effort. CTAB Abd:  No distention. Nontender to deep palpation throughout Other:  No deformities appreciated   ED Results / Procedures / Treatments   Labs (all labs ordered are listed, but only abnormal results are displayed) Labs Reviewed  COMPREHENSIVE METABOLIC PANEL WITH GFR - Abnormal; Notable for the following components:      Result Value   CO2 16 (*)    Glucose, Bld 150 (*)    Total Protein 8.2 (*)    Albumin 5.1 (*)    Anion gap 23 (*)    All other components within normal limits  CBC WITH DIFFERENTIAL/PLATELET - Abnormal; Notable for the following components:   WBC 22.2 (*)    Neutro Abs 20.0 (*)    Abs Immature Granulocytes 0.10 (*)    All other components within normal limits  SALICYLATE LEVEL - Abnormal; Notable for the following components:   Salicylate Lvl <7.0 (*)    All other components within normal limits  ACETAMINOPHEN  LEVEL - Abnormal; Notable for the following components:   Acetaminophen  (Tylenol ), Serum <10 (*)    All other components within normal limits  URINE DRUG SCREEN - Abnormal; Notable for the  following components:   Tetrahydrocannabinol POSITIVE (*)    All other components within normal limits  URINALYSIS, COMPLETE (UACMP) WITH MICROSCOPIC - Abnormal; Notable for the following components:   Color, Urine YELLOW (*)    APPearance HAZY (*)    Hgb urine dipstick MODERATE (*)    Ketones, ur 20 (*)    Protein, ur 30 (*)    Bacteria, UA RARE (*)    All other components within normal limits  CULTURE, BLOOD (ROUTINE X 2)  CULTURE, BLOOD (ROUTINE X 2)  URINE CULTURE  ETHANOL  HCG, QUANTITATIVE, PREGNANCY  LACTIC ACID, PLASMA  LACTIC ACID, PLASMA  HIV ANTIBODY (ROUTINE TESTING W REFLEX)     EKG  Ecg  = sinus tachycardia, rate 117, no gross ST elevation or depression, no significant repolarization abnormality, normal axis, normal intervals.  No clear evidence of ischemia no arrhythmia my interpretation.   RADIOLOGY Radiology interpreted by myself and radiology report reviewed.  No acute pathology identified.    PROCEDURES:  Critical Care performed: Yes, see critical care procedure note(s)  .Critical Care  Performed by: Clarine Ozell LABOR, MD Authorized by: Clarine Ozell LABOR, MD   Critical care provider statement:    Critical care time (minutes):  30   Critical care time was exclusive of:  Separately billable procedures and treating other patients   Critical care was necessary to treat or prevent imminent or life-threatening deterioration of the following conditions:  Sepsis   Critical care was time spent personally by me on the following activities:  Development of treatment plan with patient or surrogate, discussions with consultants, evaluation of patient's response to treatment, examination of patient, ordering and review of laboratory studies, ordering and review of radiographic studies, ordering and performing treatments and interventions, pulse oximetry, re-evaluation of patient's condition and review of old charts   I assumed direction of critical care for this patient from another provider in my specialty: no     Care discussed with: admitting provider      MEDICATIONS ORDERED IN ED: Medications  enoxaparin  (LOVENOX ) injection 30 mg (has no administration in time range)  sodium chloride  flush (NS) 0.9 % injection 3 mL (3 mLs Intravenous Not Given 11/26/24 1344)  acetaminophen  (TYLENOL ) tablet 650 mg (has no administration in time range)    Or  acetaminophen  (TYLENOL ) suppository 650 mg (has no administration in time range)  polyethylene glycol (MIRALAX  / GLYCOLAX ) packet 17 g (has no administration in time range)  levETIRAcetam  (KEPPRA ) undiluted injection 500 mg (has no  administration in time range)  lactated ringers  infusion ( Intravenous New Bag/Given 11/26/24 1407)  levofloxacin  (LEVAQUIN ) IVPB 750 mg (has no administration in time range)  LORazepam  (ATIVAN ) injection 1 mg (has no administration in time range)  sodium chloride  0.9 % bolus 1,000 mL (1,000 mLs Intravenous New Bag/Given 11/26/24 1129)  levofloxacin  (LEVAQUIN ) IVPB 750 mg (750 mg Intravenous New Bag/Given 11/26/24 1215)  levETIRAcetam  (KEPPRA ) undiluted injection 1,000 mg (1,000 mg Intravenous Given 11/26/24 1326)  sodium chloride  0.9 % bolus 1,000 mL (1,000 mLs Intravenous New Bag/Given 11/26/24 1331)     IMPRESSION / MDM / ASSESSMENT AND PLAN / ED COURSE  I reviewed the triage vital signs and the nursing notes.                              DDX/MDM/AP: Differential diagnosis includes, but is not limited to, AMS in setting of substance use, did reportedly have seizure with  EMS --suspect possible postictal state as well as sedation from Versed which was appropriately given.  Consider underlying metabolic abnormality.  No clear findings to suggest infection at this time.  Doubt CVA.  Consider possibility of intracranial hemorrhage given found down though no clear external findings to suggest this.  Plan: - CT head - cxr -  labs -  cardiac monitor - Reassess  Patient's presentation is most consistent with acute presentation with potential threat to life or bodily function.  The patient is on the cardiac monitor to evaluate for evidence of arrhythmia and/or significant heart rate changes.  ED course below.  With notable leukocytosis to 22, unclear if stress response or secondary to underlying infection.  Urinalysis subsequently with notable pyuria concerning for UTI.  Patient with borderline axillary temperatures in the ED (99.74F), high concern that she has developed a fever.  Suspect UTI/pyelonephritis as lowered seizure threshold.  Mentation is improving appropriately here and no recurrent episodes  to suggest status epilepticus at this time.  Labs with evidence of dehydration.  Giving 2 L IV fluid and treating with levofloxacin  based on antibiotic allergies.  Admitted to hospitalist service.  Does appear postictal here in emergency department.  Clinical Course as of 11/26/24 1524  Fri Nov 26, 2024  1146 CBC with leukocytosis to 22.  Appears had similar leukocytosis 2 months ago at last lab draw.  Consider stress response in setting of seizure.  Less likely infectious etiology at this time though will remain in differential. [MM]  1154 Urinalysis concerning for UTI, will treat [MM]  1155 Alcohol, Ethyl (B): <15 [MM]  1155 HCG, Beta Chain, Quant, S: <1 [MM]  1208 Bicarb low at 16, getting IV fluid [MM]  1208 Acetaminophen  (Tylenol ), S(!): <10 [MM]  1208 Salicylate Lvl(!): <7.0 [MM]  1208 CT head with no hemorrhage on my interpretation, formal radiology report pending [MM]  1208 X-ray reviewed, unremarkable my interpretation [MM]  1220 Patient reevaluated.  Mentation is improving.  Does respond to some basic questions.  Does feel warm to touch, axillary temperature 99.7, did not tolerate attempt at oral.  I do suspect patient has developed a fever.   Given this, I do have concern for underlying sepsis given notable leukocytosis, tachycardia, and fever.  Suspect this is lowering seizure threshold.  Will load with Keppra , already treating with levofloxacin  based on severe penicillin allergy, appears never to have received cephalosporins in the past.  Will add blood cultures and lactate and give further IV fluid.  Also add urine culture. [MM]  1224 Hospitalist consult order placed [MM]  1227 CTH: IMPRESSION: 1. Examination is limited by suboptimal angulation and subsequent artifact. 2. Within this limitation, no acute intracranial hemorrhage or mass effect is seen. 3. No calvarial fracture.   [MM]  1228 CXR: IMPRESSION: No active disease.   [MM]    Clinical Course User Index [MM]  Clarine Ozell LABOR, MD     FINAL CLINICAL IMPRESSION(S) / ED DIAGNOSES   Final diagnoses:  Altered mental status, unspecified altered mental status type  Seizure (HCC)  Acute pyelonephritis  Dehydration  Sepsis, due to unspecified organism, unspecified whether acute organ dysfunction present [facility])     Rx / DC Orders   ED Discharge Orders     None        Note:  This document was prepared using Dragon voice recognition software and may include unintentional dictation errors.   Clarine Ozell LABOR, MD 11/26/24 1524    Clarine Ozell LABOR, MD 11/26/24  1524 ° °"

## 2024-11-26 NOTE — Plan of Care (Addendum)
 Patient just had another seizure - only about 25 sec. Shaking mainly head and chest, Diaphoretic, nauseous and reported difficulty seeing. Did not loose consciousness. However, patient did bite the tip of his tongue.  Hosp informed and consulted Neuro.  Ativan  another dose of keppra  ordered.  1510 Bedside swallow attempted. I tried apple sauce 1st, but seems to struggle a bit/ painful to swallow. [place] he has had few days of vomitting - maybe his throat is tore up. Sip of water also went down ok - but struggled with using a spoon or straw. Needed to be coaxed.   1530 - Patient still having trouble with vision. States only seeing shadows from LF eye and bilat eyes non-reactive to light or motion.  Patient also has great difficulty holding eyes open - especially his RT.  Patient did state,  Its been a while since I had a seizure, but this has happened, after a seizure, in the past. [facility] informed, MRI ordered.Neuro also ordered EEG.

## 2024-11-26 NOTE — H&P (Addendum)
 " History and Physical   Patricia Guerra FMW:[id number] DOB: 1991/04/30 DOA: 11/26/2024  PCP: Patient, No Pcp Per   Patient coming from: Home  Chief Complaint: Altered mental status, seizure  HPI: Patricia Guerra is a 34 y.o. 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  EMS was called out after patient's mother noted him to be altered at home.  Patient had been little bit off yesterday and may have been using some substances per mother possibly cough syrup.  Patient noted to be drowsy around 4:30 AM by mother this morning.  Patient left and returned around 10:30 AM and found patient be nonresponsive.  Called EMS.  Patient has history of seizure and substance use.  Patient's mother thought this looked most consistent with prior postictal state.  Patient did have witnessed seizure lasting 30 seconds by EMS and route.  Received Versed.  Has had some improvement in following simple commands in the ED but remains postictal.  Feel thristy and uncomfortable. Denies fevers, chills, Chest pain, SOB, abdominal pain.  ED Course: Vital signs in the ED notable for heart rate in the 90s-110s, blood pressure in the 90s-110s.  Lab workup included CMP with bicarb 16, gap 23, glucose 150, protein 8.2, albumin 5.1.  CBC showed leukocytosis to 22.2.  Lactic acid pending.  Ethanol negative, ASA negative, APAP normal.  UDS with THC only.  Urinalysis with hemoglobin, ketones, bilirubin, bacteria.  Urine culture and blood culture pending.  Chest x-ray showed no acute normality.  CT head was limited by angulation however showed no acute abnormality.  Patient received 1 g IV Keppra , IV levofloxacin , 2 L IV fluid in the ED.  Review of Systems: Performed and negative except as noted in HPI.  Past Medical History:  Diagnosis Date   Seizures (HCC)      History reviewed. No pertinent surgical history.  Social History  reports that he has been smoking cigarettes. He has never used smokeless tobacco. He reports that he does not currently use drugs after having used the following drugs: Marijuana. He reports that he does not drink alcohol.  Allergies[1]  History reviewed. No pertinent family history.  Prior to Admission medications  Medication Sig Start Date End Date Taking? Authorizing Provider  hydrOXYzine  (ATARAX ) 25 MG tablet Take 25 mg by mouth 2 (two) times daily as needed for anxiety. 09/22/24  Yes [provider]  ibuprofen  (ADVIL ,MOTRIN ) 200 MG tablet Take 600-800 mg by mouth every 4 (four) hours as needed for fever, headache, mild pain, moderate pain or cramping.   Yes [provider]  OLANZapine  (ZYPREXA ) 10 MG tablet Take 5 mg by mouth in the morning and at bedtime. 09/22/24  Yes [provider]  clonazePAM (KLONOPIN) 0.5 MG tablet Take 0.125 mg by mouth daily. Patient not taking: Reported on 11/26/2024 08/05/17   [provider]  levETIRAcetam  (KEPPRA ) 500 MG tablet Take 1 tablet (500 mg total) by mouth 2 (two) times daily. Patient not taking: Reported on 11/26/2024 07/30/17   Dorothyann Drivers, MD  levETIRAcetam  (KEPPRA ) 500 MG tablet Take 1 tablet (500 mg total) by mouth 2 (two) times daily. Patient not taking: Reported on 11/26/2024 10/07/17   Bluford Rogue, MD    Physical Exam: Vitals:   11/26/24 1130 11/26/24 1200 11/26/24 1207 11/26/24 1230  BP: 111/77 95/62  117/72  Pulse: 99 (!) 116 93 96  Resp: (!) 29  15  Temp:      SpO2: 97% 98% 97% 100%  Weight:      Height:        Physical Exam Constitutional:      General: He is not in acute distress.    Appearance: Normal appearance.  HENT:     Head: Normocephalic and atraumatic.     Mouth/Throat:     Mouth: Mucous membranes are moist.     Pharynx: Oropharynx is clear.  Eyes:     Extraocular Movements: Extraocular movements intact.      Pupils: Pupils are equal, round, and reactive to light.  Cardiovascular:     Rate and Rhythm: Normal rate and regular rhythm.     Pulses: Normal pulses.     Heart sounds: Normal heart sounds.  Pulmonary:     Effort: Pulmonary effort is normal. No respiratory distress.     Breath sounds: Normal breath sounds.  Abdominal:     General: Bowel sounds are normal. There is no distension.     Palpations: Abdomen is soft.     Tenderness: There is no abdominal tenderness.  Musculoskeletal:        General: No swelling or deformity.  Skin:    General: Skin is warm and dry.  Neurological:     General: No focal deficit present.     Comments: Postictal, but responds to voice and starting to ask for something to drink.    Labs on Admission: I have personally reviewed following labs and imaging studies  CBC: Recent Labs  Lab 11/26/24 1110  WBC 22.2*  NEUTROABS 20.0*  HGB 13.7  HCT 41.8  MCV 92.3  PLT 361    Basic Metabolic Panel: Recent Labs  Lab 11/26/24 1110  NA 140  K 3.7  CL 102  CO2 16*  GLUCOSE 150*  BUN 16  CREATININE 1.00  CALCIUM 9.8    GFR: Estimated Creatinine Clearance (by C-G formula based on SCr of 1 mg/dL) Female: 46.7 mL/min Female: 62.6 mL/min  Liver Function Tests: Recent Labs  Lab 11/26/24 1110  AST 19  ALT 12  ALKPHOS 55  BILITOT 0.6  PROT 8.2*  ALBUMIN 5.1*    Urine analysis:    Component Value Date/Time   COLORURINE YELLOW (A) 11/26/2024 1124   APPEARANCEUR HAZY (A) 11/26/2024 1124   APPEARANCEUR Hazy 07/08/2014 0957   LABSPEC 1.023 11/26/2024 1124   LABSPEC 1.025 07/08/2014 0957   PHURINE 5.0 11/26/2024 1124   GLUCOSEU NEGATIVE 11/26/2024 1124   GLUCOSEU Negative 07/08/2014 0957   HGBUR MODERATE (A) 11/26/2024 1124   BILIRUBINUR NEGATIVE 11/26/2024 1124   BILIRUBINUR Negative 07/08/2014 0957   KETONESUR 20 (A) 11/26/2024 1124   PROTEINUR 30 (A) 11/26/2024 1124   NITRITE NEGATIVE 11/26/2024 1124   LEUKOCYTESUR NEGATIVE  11/26/2024 1124   LEUKOCYTESUR 1+ 07/08/2014 0957    Radiological Exams on Admission: DG Chest Portable 1 View Result Date: 11/26/2024 CLINICAL DATA:  Seizures. EXAM: PORTABLE CHEST 1 VIEW COMPARISON:  September 10, 2024 FINDINGS: The heart size and mediastinal contours are within normal limits. No acute infiltrate, pleural effusion or pneumothorax is identified. The visualized skeletal structures are unremarkable. IMPRESSION: No active disease. Electronically Signed   By: Suzen Dials M.D.   On: 11/26/2024 12:25   CT HEAD WO CONTRAST ( ) Result Date: 11/26/2024 EXAM: CT HEAD WITHOUT CONTRAST 11/26/2024 11:58:24 AM TECHNIQUE: CT of the head was performed without the administration of intravenous contrast. Automated exposure control, iterative reconstruction, and/or weight based adjustment of  the mA/kV was utilized to reduce the radiation dose to as low as reasonably achievable. COMPARISON: Head CT 09/10/2024. CLINICAL HISTORY: AMS, seizure. FINDINGS: LIMITATIONS/ARTIFACTS: Exam is limited by suboptimal angulation and subsequent artifact. BRAIN AND VENTRICLES: No definite acute hemorrhage. No evidence of acute territorial infarct. No hydrocephalus. No large extra-axial collection. No large mass effect or midline shift. ORBITS: No acute abnormality. SINUSES: No acute abnormality. SOFT TISSUES AND SKULL: No acute soft tissue abnormality. No calvarial fracture. Left mandibular coronoid process tip minimally displaced fracture is redemonstrated. IMPRESSION: 1. Examination is limited by suboptimal angulation and subsequent artifact. 2. Within this limitation, no acute intracranial hemorrhage or mass effect is seen. 3. No calvarial fracture. Electronically signed by: Prentice Spade MD 11/26/2024 12:24 PM EST RP Workstation: [id number]   EKG: Independently reviewed.  Sinus tachycardia at 117 bpm.  Nonspecific T wave changes.  RSR prime in V2.  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  The prior workup indicated there was thought that patient could have psychogenic nonepileptic seizures as well. > Med rec shows patient not taking seizure medication recently. > Concern for possible lowered seizure threshold in the setting of possible substance use.  May have been using cough syrup.  UDS positive for THC. > Threshold could have also been lowered by possible UTI.  Urinalysis with ketones, bilirubin, hemoglobin, rare bacteria, white cells.  With leukocytosis to 22.2. > Lactic acid pending but anticipate this being elevated whether it was seizure or related to infection.  Leukocytosis equally could be secondary to infection versus reactive from seizure. > Received levofloxacin  for UTI given penicillin allergy, Keppra  for seizure, IV fluids. - Monitor on progressive unit for now.  Blood pressure is intermittently in 90s however baseline appears to be in the 100s systolic. - Continue with Keppra  IV for now - Continue with levofloxacin  - EEG - Trend fever curve and WBC - Follow-up urine culture and blood culture - Trend lactic acid - Continue IV fluids - Seizure precautions - Supportive care  ADDENDUM > RN report repeat/breakthrough seizure lasing approximately 30 sec around 3pm. > Given patient has already received 1 g Keppra  load this was discussed with neurology.  Patient's Keppra  load was a little low so they recommend giving additional Keppra , continue with EEG, consider LTM EEG/transfer to [facility]. - Appreciate neurology recommendations and assistance - Additional 1 g Keppra , continue with 500 twice daily - Stat EEG - As needed Ativan   for breakthrough seizure  ADDENDUM 2 > Patient reporting visual changes after repeat seizure.  Blurry vision greater on the right than left. > Neuroexam done showed no other focal deficits, had some initial reduced strength at the left upper extremity but on prompting strength was normal. - Continue to prioritize stat EEG - MRI brain stat following EEG - Mother updated by phone  Elevated anion gap metabolic acidosis > Patient noted to have bicarb 16 and gap of 23 on admission. > This was presumed to be secondary to elevated lactic acid after seizure.  Initial lactic acid normal. - Will check beta hydroxybutyric acid and repeat CMP to reevaluate if gap remains elevated. - VBG  DVT prophylaxis: Lovenox  Code Status:  Full Family Communication:  None on admission (spoke with EDP previously), updated after repeat seizure event.  Disposition Plan:   Patient is from:  Home  Anticipated DC to:  Home  Anticipated DC date:  1 to 4 days  Anticipated DC barriers: None  Consults called:  None Admission status:  Observation, progressive  Severity of Illness: The appropriate patient status for this patient is OBSERVATION. Observation status is judged to be reasonable and necessary in order to provide the required intensity of service to ensure the patient's safety. The patient's presenting symptoms, physical exam findings, and initial radiographic and laboratory data in the context of their medical condition is felt to place them at decreased risk for further clinical deterioration. Furthermore, it is anticipated that the patient will be medically stable for discharge from the hospital within 2 midnights of admission.    Marsa KATHEE Scurry MD [facility]  How to contact the [facility] Attending or Consulting provider 7A - 7P or covering provider during after hours 7P -7A, for this patient?   Check the care team in [facility] and look for a) attending/consulting [facility] provider listed and b) the [facility] team  listed Log into [email or web address] and use [place]'s universal password to access. If you do not have the password, please contact the hospital operator. Locate the [facility] provider you are looking for under [facility] and page to a number that you can be directly reached. If you still have difficulty reaching the provider, please page the [facility] (Director on Call) for the Hospitalists listed on amion for assistance.  11/26/2024, 1:25 PM       [1]  Allergies Allergen Reactions   Penicillins Anaphylaxis and Swelling    Has patient had a PCN reaction causing immediate rash, facial/tongue/throat swelling, SOB or lightheadedness with hypotension: Yes Has patient had a PCN reaction causing severe rash involving mucus membranes or skin necrosis: No Has patient had a PCN reaction that required hospitalization: Yes, was already in the hospital Has patient had a PCN reaction occurring within the last 10 years: No If all of the above answers are NO, then may proceed with Cephalosporin use.   "

## 2024-11-26 NOTE — ED Notes (Signed)
 Pt @ MRI, will assess once pt returns.

## 2024-11-26 NOTE — Progress Notes (Signed)
 Eeg done

## 2024-11-27 DIAGNOSIS — G9340 Encephalopathy, unspecified: Secondary | ICD-10-CM | POA: Diagnosis not present

## 2024-11-27 DIAGNOSIS — R4182 Altered mental status, unspecified: Secondary | ICD-10-CM | POA: Diagnosis not present

## 2024-11-27 DIAGNOSIS — E43 Unspecified severe protein-calorie malnutrition: Secondary | ICD-10-CM | POA: Diagnosis present

## 2024-11-27 DIAGNOSIS — E872 Acidosis, unspecified: Secondary | ICD-10-CM | POA: Diagnosis present

## 2024-11-27 DIAGNOSIS — K529 Noninfective gastroenteritis and colitis, unspecified: Secondary | ICD-10-CM | POA: Diagnosis present

## 2024-11-27 DIAGNOSIS — Z880 Allergy status to penicillin: Secondary | ICD-10-CM | POA: Diagnosis not present

## 2024-11-27 DIAGNOSIS — M79601 Pain in right arm: Secondary | ICD-10-CM | POA: Diagnosis present

## 2024-11-27 DIAGNOSIS — F1211 Cannabis abuse, in remission: Secondary | ICD-10-CM | POA: Diagnosis present

## 2024-11-27 DIAGNOSIS — Z91148 Patient's other noncompliance with medication regimen for other reason: Secondary | ICD-10-CM | POA: Diagnosis not present

## 2024-11-27 DIAGNOSIS — F640 Transsexualism: Secondary | ICD-10-CM | POA: Diagnosis present

## 2024-11-27 DIAGNOSIS — E162 Hypoglycemia, unspecified: Secondary | ICD-10-CM | POA: Diagnosis present

## 2024-11-27 DIAGNOSIS — E860 Dehydration: Secondary | ICD-10-CM | POA: Diagnosis present

## 2024-11-27 DIAGNOSIS — F17210 Nicotine dependence, cigarettes, uncomplicated: Secondary | ICD-10-CM | POA: Diagnosis present

## 2024-11-27 DIAGNOSIS — G929 Unspecified toxic encephalopathy: Secondary | ICD-10-CM | POA: Diagnosis not present

## 2024-11-27 DIAGNOSIS — E878 Other disorders of electrolyte and fluid balance, not elsewhere classified: Secondary | ICD-10-CM | POA: Diagnosis not present

## 2024-11-27 DIAGNOSIS — Z23 Encounter for immunization: Secondary | ICD-10-CM | POA: Diagnosis not present

## 2024-11-27 DIAGNOSIS — Z79899 Other long term (current) drug therapy: Secondary | ICD-10-CM | POA: Diagnosis not present

## 2024-11-27 DIAGNOSIS — N10 Acute tubulo-interstitial nephritis: Secondary | ICD-10-CM | POA: Diagnosis present

## 2024-11-27 DIAGNOSIS — R569 Unspecified convulsions: Secondary | ICD-10-CM | POA: Diagnosis not present

## 2024-11-27 DIAGNOSIS — F329 Major depressive disorder, single episode, unspecified: Secondary | ICD-10-CM | POA: Diagnosis present

## 2024-11-27 DIAGNOSIS — F419 Anxiety disorder, unspecified: Secondary | ICD-10-CM | POA: Diagnosis present

## 2024-11-27 DIAGNOSIS — E876 Hypokalemia: Secondary | ICD-10-CM | POA: Diagnosis present

## 2024-11-27 DIAGNOSIS — F32A Depression, unspecified: Secondary | ICD-10-CM | POA: Diagnosis not present

## 2024-11-27 DIAGNOSIS — H109 Unspecified conjunctivitis: Secondary | ICD-10-CM | POA: Diagnosis present

## 2024-11-27 DIAGNOSIS — Z681 Body mass index (BMI) 19 or less, adult: Secondary | ICD-10-CM | POA: Diagnosis not present

## 2024-11-27 DIAGNOSIS — Z789 Other specified health status: Secondary | ICD-10-CM | POA: Diagnosis not present

## 2024-11-27 DIAGNOSIS — G40909 Epilepsy, unspecified, not intractable, without status epilepticus: Secondary | ICD-10-CM | POA: Diagnosis present

## 2024-11-27 LAB — COMPREHENSIVE METABOLIC PANEL WITH GFR
ALT: 9 U/L (ref 0–44)
AST: 15 U/L (ref 15–41)
Albumin: 4.1 g/dL (ref 3.5–5.0)
Alkaline Phosphatase: 43 U/L (ref 38–126)
Anion gap: 14 (ref 5–15)
BUN: 10 mg/dL (ref 6–20)
CO2: 19 mmol/L — ABNORMAL LOW (ref 22–32)
Calcium: 9 mg/dL (ref 8.9–10.3)
Chloride: 105 mmol/L (ref 98–111)
Creatinine, Ser: 0.57 mg/dL (ref 0.44–1.00)
GFR, Estimated: >60 mL/min
Glucose, Bld: 75 mg/dL (ref 70–99)
Potassium: 3.4 mmol/L — ABNORMAL LOW (ref 3.5–5.1)
Sodium: 138 mmol/L (ref 135–145)
Total Bilirubin: 0.9 mg/dL (ref 0.0–1.2)
Total Protein: 6.5 g/dL (ref 6.5–8.1)

## 2024-11-27 LAB — CBC
HCT: 35.3 % — ABNORMAL LOW (ref 36.0–46.0)
Hemoglobin: 11.9 g/dL — ABNORMAL LOW (ref 12.0–15.0)
MCH: 30.2 pg (ref 26.0–34.0)
MCHC: 33.7 g/dL (ref 30.0–36.0)
MCV: 89.6 fL (ref 80.0–100.0)
Platelets: 269 K/uL (ref 150–400)
RBC: 3.94 MIL/uL (ref 3.87–5.11)
RDW: 11.9 % (ref 11.5–15.5)
WBC: 11.4 K/uL — ABNORMAL HIGH (ref 4.0–10.5)
nRBC: 0 % (ref 0.0–0.2)

## 2024-11-27 MED ORDER — POTASSIUM CHLORIDE CRYS ER 20 MEQ PO TBCR
40.0000 meq | EXTENDED_RELEASE_TABLET | Freq: Once | ORAL | Status: AC
  Administered 2024-11-27: 40 meq via ORAL
  Filled 2024-11-27: qty 2

## 2024-11-27 MED ORDER — SODIUM BICARBONATE 650 MG PO TABS
650.0000 mg | ORAL_TABLET | Freq: Two times a day (BID) | ORAL | Status: DC
  Administered 2024-11-27 – 2024-11-29 (×6): 650 mg via ORAL
  Filled 2024-11-27 (×6): qty 1

## 2024-11-27 NOTE — Hospital Course (Signed)
 Patricia Guerra is a 34 y.o. 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  Patient had a witnessed seizure lasting 30 minutes observed by EMS. Upon arriving to hospital, she was loaded with Keppra .  Seen by neurology.  MRI of the brain did not show any acute changes, EEG did not show any seizure activity. However, patient has having chronically illness, she has chronic diarrhea, she has severe malnutrition.  She has persistent hypoglycemia.  D5 is started.  TSH and cortisol level are normal.

## 2024-11-27 NOTE — ED Notes (Signed)
 Pt bed alarm was going off and upon entering room pt was standing on the far side of the room trying to grab some paper towels to blow his nose. Pt given a box of tissues and informed to let the care team know if he needs anything by using the call light. Pt safely escorted back into the stretcher. No further requests at this time.

## 2024-11-27 NOTE — Procedures (Signed)
 Patient Name: Rital Cavey  MRN: [id number]  Epilepsy Attending: Arlin MALVA Krebs  Referring Physician/Provider: Seena Marsa NOVAK, MD  Date: 11/26/2024 Duration: 25.57 mins  Patient history: 34yo F with ams. EEG to evaluate for seizure  Level of alertness: comatose/ lethargic   AEDs during EEG study: LEV  Technical aspects: This EEG study was done with scalp electrodes positioned according to the 10-20 International system of electrode placement. Electrical activity was reviewed with band pass filter of 1-70Hz , sensitivity of 7 uV/mm, display speed of 31mm/sec with a 60Hz  notched filter applied as appropriate. EEG data were recorded continuously and digitally stored.  Video monitoring was available and reviewed as appropriate.  Description: EEG showed continuous generalized 3 to 6 Hz theta-delta slowing admixed with 15 to 18 Hz beta activity distributed symmetrically and diffusely. Hyperventilation and photic stimulation were not performed.     ABNORMALITY - Continuous slow, generalized  IMPRESSION: This study is suggestive of generalized cerebral dysfunction (encephalopathy). No seizures or epileptiform discharges were seen throughout the recording.  Pheobe Sandiford O Adella Manolis

## 2024-11-27 NOTE — ED Notes (Signed)
 Pt linens changed, pt peri care done independently, and pt changed into underwear, brief, and pants. Bed alarm is on. Bed in lowest position.

## 2024-11-27 NOTE — Progress Notes (Signed)
" °  Progress Note   Patient: Patricia Guerra FMW:[id number] DOB: 1991/09/08 DOA: 11/26/2024     0 DOS: the patient was seen and examined on 11/27/2024   Brief hospital course: Patricia Guerra is a 34 y.o. 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  Patient had a witnessed seizure lasting 30 minutes observed by EMS. Upon arriving to hospital, she was loaded with Keppra .  Seen by neurology.  MRI of the brain did not show any acute changes, EEG did not show any seizure activity.   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 Seizure disorder. Patient has not been taking her seizure medicine for few months, which had resulted in her seizure activity.  Per neurology, patient restarted on Keppra . Discussed with Dr. Lindzen, patient still has some disorientation and memory defect, prefer to keep in the hospital for another day. Patient does not have an any urinary symptoms, UA was negative.  No evidence of UTI, I will discontinue antibiotics. Advised patient to not drive until cleared by neurology.  Metabolic acidosis secondary to seizure. Hypokalemia. Metabolic acidosis improving today, will give sodium bicarb orally, also replete potassium.  Recheck a BMP and magnesium tomorrow.  Leukocytosis. Much improved today, appears to be secondary to seizure.     Subjective:  Patient feels better today, she has some disorientation, but oriented to time and place.  Physical Exam: Vitals:   11/27/24 1030 11/27/24  1100 11/27/24 1130 11/27/24 1200  BP: 99/74 110/70 108/70 113/69  Pulse: 77 76 71 74  Resp: 14 17 17 19   Temp:      TempSrc:      SpO2: 100% 100% 100% 100%  Weight:      Height:       General exam: Appears calm and comfortable  Respiratory system: Clear to auscultation. Respiratory effort normal. Cardiovascular system: S1 & S2 heard, RRR. No JVD, murmurs, rubs, gallops or clicks. No pedal edema. Gastrointestinal system: Abdomen is nondistended, soft and nontender. No organomegaly or masses felt. Normal bowel sounds heard. Central nervous system: Alert and oriented. No focal neurological deficits. Extremities: Symmetric 5 x 5 power. Skin: No rashes, lesions or ulcers Psychiatry: Judgement and insight appear normal. Mood & affect appropriate.    Data Reviewed:  Reviewed the EEG, MRI of the brain, lab results.  Family Communication: None  Disposition: Status is: Observation      Time spent: 35 minutes  Author: Murvin Mana, MD 11/27/2024 12:07 PM  For on call review [email or web address].    "

## 2024-11-27 NOTE — Progress Notes (Addendum)
 NEUROLOGY CONSULT FOLLOW UP NOTE   Date of service: November 27, 2024 Patient Name: Patricia Guerra MRN:  [id number] DOB:  [birth year]  Interval Hx/subjective  Patient now with improvement in his AMS since yesterday's exam. Now more alert when awakened and no longer as somnolent. However, he still has significant memory deficits.  Vitals   Vitals:   11/27/24 0800 11/27/24 0830 11/27/24 0900 11/27/24 1003  BP: 103/67 107/74 103/82   Pulse: 73 82 82   Resp: 11 19 20    Temp:    98.6 F (37 C)  TempSrc:    Oral  SpO2: 100% 100% 100%   Weight:      Height:         Body mass index is 15.31 kg/m.  Physical Exam   Constitutional: Appears well-developed and well-nourished.  Eyes: No scleral injection.  HENT: No OP obstrucion.  Head: Normocephalic. No neck stiffness Respiratory: Effort normal, non-labored breathing.  Skin: WDI.  Neurologic Examination   Mental Status: Mildly drowsy. Awakens to voice and attends to questions and commands, but with decreased level of alertness and poor attention. Not oriented to the year, the month, the day of the week or the city, but correctly identifies the state. Frequently laughs inappropriately. Speech is sparse but fluent with intact comprehension and naming. No dysarthria. Poor insight.  Cranial Nerves: II: PERRL. III,IV, VI: No ptosis. EOMI, but with jerky smooth pursuits. No nystagmus. VII: Smile symmetric VIII: Hearing intact to voice IX,X: No hypophonia or hoarseness XI: Symmetric XII: No lingual dysarthria Motor: RUE: 4+/5 LUE: 4+/5 RLE: 4+/5 LLE: 4+/5 No asymmetry Sensory: Endorses sensory numbness to FT stimuli applied to hands and feet bilaterally. No extinction to DSS. Plantars: Right: downgoingLeft: downgoing Cerebellar: No ataxia with FNF bilaterally Gait: Deferred   Medications Current Medications[1]  Labs and Diagnostic Imaging   CBC:  Recent Labs  Lab 11/26/24 1110 11/27/24 0824  WBC 22.2* 11.4*   NEUTROABS 20.0*  --   HGB 13.7 11.9*  HCT 41.8 35.3*  MCV 92.3 89.6  PLT 361 269    Basic Metabolic Panel:  Lab Results  Component Value Date   NA 138 11/27/2024   K 3.4 (L) 11/27/2024   CO2 19 (L) 11/27/2024   GLUCOSE 75 11/27/2024   BUN 10 11/27/2024   CREATININE 0.57 11/27/2024   CALCIUM 9.0 11/27/2024   GFRNONAA >60 11/27/2024   GFRAA >60 10/07/2017   Lipid Panel: No results found for: LDLCALC HgbA1c: No results found for: HGBA1C Urine Drug Screen:     Component Value Date/Time   LABOPIA NEGATIVE 11/26/2024 1124   COCAINSCRNUR NEGATIVE 11/26/2024 1124   COCAINSCRNUR NONE DETECTED 09/10/2024 2038   LABBENZ NEGATIVE 11/26/2024 1124   AMPHETMU NEGATIVE 11/26/2024 1124   THCU POSITIVE (A) 11/26/2024 1124   LABBARB NEGATIVE 11/26/2024 1124    Alcohol Level     Component Value Date/Time   [facility] <15 11/26/2024 1110     Assessment  Patricia Guerra is a 34 y.o. 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 He had one seizure en route that lasted for about 30 seconds. He was loaded with Keppra  1000 mg IV in the ED. At 3 PM he had another seizure, lasting again for about 30 seconds. Throughout this period he has been delirious,  with lethargy and somnolence. Mother suspects substance use at home overnight.  - Exam reveals improved mentation today, but still with significant disorientation and memory loss.    - CT head: Examination is limited by suboptimal angulation and subsequent artifact. Within this limitation, no acute intracranial hemorrhage or mass effect is seen. No calvarial fracture. - Official report conclusions for EEG obtained yesterday: Continuous slow, generalized. This study is suggestive of generalized cerebral dysfunction  (encephalopathy). No seizures or epileptiform discharges were seen throughout the recording. - MRI brain: No acute findings.  - Impression:  - Acute onset of AMS. Overall presentation is most consistent with a resolving toxic encephalopathy.   - Seizures versus pseudoseizures. More likely the events that the patient had were epileptic seizures, due to evidence for tongue-biting as well as a possibly lowered seizure threshold given mother's suspicion of substance abuse overnight on Thurs-Friday.   - Past history of seizures. Has not been compliant with Keppra , per patient, but he is unable to state for how long.    Recommendations  - Supportive care per primary team - IV hydration - Continue Keppra  at 500 mg BID. Can switch to PO - Avoid serotonergic medications - Inpatient seizure precautions - Outpatient seizure precautions: Per [place]  DMV statutes, patients with seizures are not allowed to drive until  they have been seizure-free for six months. Use caution when using heavy equipment or power tools. Avoid working on ladders or at heights. Take showers instead of baths. Ensure the water temperature is not too high on the home water heater. Do not go swimming alone. When caring for infants or small children, sit down when holding, feeding, or changing them to minimize risk of injury to the child in the event you have a seizure. Also, Maintain good sleep hygiene. Avoid alcohol. - Psychiatry consult for substance abuse in the setting of his history of depression and anxiety - Outpatient Neurology follow up - Neurohospitalist service will sign off. Please call if there are additional questions.   ______________________________________________________________________   Bonney SHARK, Jacqlyn Marolf, MD [facility]     [1]  Current Facility-Administered Medications:    acetaminophen  (TYLENOL ) tablet 650 mg, 650 mg, Oral, Q6H PRN **OR** acetaminophen  (TYLENOL ) suppository 650 mg,  650 mg, Rectal, Q6H PRN, Melvin, Alexander B, MD   enoxaparin  (LOVENOX ) injection 30 mg, 30 mg, Subcutaneous, Q24H, Melvin, Alexander B, MD, 30 mg at 11/26/24 1631   levETIRAcetam  (KEPPRA ) undiluted injection 500 mg, 500 mg, Intravenous, Q12H, Melvin, Alexander B, MD, 500 mg at 11/27/24 9062   LORazepam  (ATIVAN ) injection 1 mg, 1 mg, Intravenous, Q5 Min x 2 PRN, Melvin, Alexander B, MD   polyethylene glycol (MIRALAX  / GLYCOLAX ) packet 17 g, 17 g, Oral, Daily PRN, Melvin, Alexander B, MD   potassium chloride  SA (KLOR-CON  M) CR tablet 40 mEq, 40 mEq, Oral, Once, Laurita Pillion, MD   sodium chloride  flush (NS) 0.9 % injection 3 mL, 3 mL, Intravenous, Q12H, Melvin, Alexander B, MD, 3 mL at 11/27/24 0906  Current Outpatient Medications:    hydrOXYzine  (ATARAX ) 25 MG tablet, Take 25 mg by mouth 2 (two) times daily as needed for anxiety., Disp: , Rfl:    ibuprofen  (ADVIL ,MOTRIN ) 200 MG tablet, Take 600-800 mg by mouth every 4 (four) hours as needed for fever, headache, mild pain, moderate pain or cramping., Disp: , Rfl:    OLANZapine  (ZYPREXA ) 10 MG tablet, Take 5 mg by mouth in the morning and at bedtime., Disp: , Rfl:    clonazePAM (KLONOPIN)  0.5 MG tablet, Take 0.125 mg by mouth daily. (Patient not taking: Reported on 11/26/2024), Disp: , Rfl:    levETIRAcetam  (KEPPRA ) 500 MG tablet, Take 1 tablet (500 mg total) by mouth 2 (two) times daily. (Patient not taking: Reported on 11/26/2024), Disp: 60 tablet, Rfl: 0   levETIRAcetam  (KEPPRA ) 500 MG tablet, Take 1 tablet (500 mg total) by mouth 2 (two) times daily. (Patient not taking: Reported on 11/26/2024), Disp: 60 tablet, Rfl: 1

## 2024-11-28 LAB — BASIC METABOLIC PANEL WITH GFR
Anion gap: 17 — ABNORMAL HIGH (ref 5–15)
BUN: 10 mg/dL (ref 6–20)
CO2: 21 mmol/L — ABNORMAL LOW (ref 22–32)
Calcium: 9.3 mg/dL (ref 8.9–10.3)
Chloride: 103 mmol/L (ref 98–111)
Creatinine, Ser: 0.65 mg/dL (ref 0.44–1.00)
GFR, Estimated: >60 mL/min
Glucose, Bld: 65 mg/dL — ABNORMAL LOW (ref 70–99)
Potassium: 3.4 mmol/L — ABNORMAL LOW (ref 3.5–5.1)
Sodium: 141 mmol/L (ref 135–145)

## 2024-11-28 LAB — PHOSPHORUS: Phosphorus: 2.3 mg/dL — ABNORMAL LOW (ref 2.5–4.6)

## 2024-11-28 LAB — URINE CULTURE: Culture: NO GROWTH

## 2024-11-28 LAB — MAGNESIUM: Magnesium: 2.1 mg/dL (ref 1.7–2.4)

## 2024-11-28 MED ORDER — LEVETIRACETAM 500 MG PO TABS
500.0000 mg | ORAL_TABLET | Freq: Two times a day (BID) | ORAL | Status: DC
  Administered 2024-11-28 – 2024-12-01 (×6): 500 mg via ORAL
  Filled 2024-11-28 (×6): qty 1

## 2024-11-28 MED ORDER — HYDROXYZINE HCL 25 MG PO TABS
25.0000 mg | ORAL_TABLET | Freq: Two times a day (BID) | ORAL | Status: DC | PRN
  Administered 2024-11-29 – 2024-11-30 (×2): 25 mg via ORAL
  Filled 2024-11-28 (×2): qty 1

## 2024-11-28 MED ORDER — CIPROFLOXACIN HCL 0.3 % OP SOLN
1.0000 [drp] | OPHTHALMIC | Status: DC
  Administered 2024-11-28 – 2024-12-01 (×12): 1 [drp] via OPHTHALMIC
  Filled 2024-11-28 (×3): qty 2.5

## 2024-11-28 MED ORDER — PANCRELIPASE (LIP-PROT-AMYL) 12000-38000 UNITS PO CPEP
24000.0000 [IU] | ORAL_CAPSULE | Freq: Three times a day (TID) | ORAL | Status: DC
  Administered 2024-11-28 – 2024-12-01 (×8): 24000 [IU] via ORAL
  Filled 2024-11-28 (×8): qty 2

## 2024-11-28 MED ORDER — POTASSIUM CHLORIDE 20 MEQ PO PACK
40.0000 meq | PACK | ORAL | Status: AC
  Administered 2024-11-28 (×2): 40 meq via ORAL
  Filled 2024-11-28 (×2): qty 2

## 2024-11-28 MED ORDER — OLANZAPINE 5 MG PO TABS
5.0000 mg | ORAL_TABLET | Freq: Every day | ORAL | Status: DC
  Administered 2024-11-28 – 2024-11-30 (×3): 5 mg via ORAL
  Filled 2024-11-28 (×3): qty 1

## 2024-11-28 MED ORDER — K PHOS MONO-SOD PHOS DI & MONO 155-852-130 MG PO TABS
500.0000 mg | ORAL_TABLET | Freq: Once | ORAL | Status: AC
  Administered 2024-11-28: 500 mg via ORAL
  Filled 2024-11-28: qty 2

## 2024-11-28 MED ORDER — LEVETIRACETAM 500 MG PO TABS: 500.0000 mg | ORAL_TABLET | Freq: Two times a day (BID) | ORAL | Status: DC

## 2024-11-28 MED ORDER — CITALOPRAM HYDROBROMIDE 10 MG PO TABS
10.0000 mg | ORAL_TABLET | Freq: Every day | ORAL | Status: DC
  Administered 2024-11-28 – 2024-12-01 (×4): 10 mg via ORAL
  Filled 2024-11-28 (×4): qty 1

## 2024-11-28 NOTE — Progress Notes (Signed)
" ° °  Brief Progress Note   _____________________________________________________________________________________________________________  Patient Name: Patricia Guerra Patient DOB: Apr 23, 1991 Date: @TODAY @      Data: Reviewed labs, notes, VS.    Action: No action needed at this time.      Response:    _____________________________________________________________________________________________________________  [facility] RN Expeditor Sharolyn JONETTA Batman Please contact us  directly via secure chat (search for [facility]) or by calling us  at [phone] [facility]).  "

## 2024-11-28 NOTE — Progress Notes (Signed)
 " Progress Note   Patient: Patricia Guerra FMW:[id number] DOB: 1991-07-22 DOA: 11/26/2024     1 DOS: the patient was seen and examined on 11/28/2024   Brief hospital course: Patricia Guerra is a 34 y.o. 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  Patient had a witnessed seizure lasting 30 minutes observed by EMS. Upon arriving to hospital, she was loaded with Keppra .  Seen by neurology.  MRI of the brain did not show any acute changes, EEG did not show any seizure activity.   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 Seizure disorder. Patient has not been taking her seizure medicine for few months, which had resulted in her seizure activity.  Per neurology, patient restarted on Keppra . Discussed with Dr. Lindzen, patient still has some disorientation and memory defect, prefer to keep in the hospital for another day. Patient does not have an any urinary symptoms, UA was negative.  No evidence of UTI, I will discontinue antibiotics. Advised patient to not drive until cleared by neurology. Patient does not have a recurrence of seizure, change Keppra  to oral.   Metabolic acidosis secondary to seizure. Hypokalemia. Hypophosphatemia. Patient has some ketosis from seizure, continue sodium bicarb.  Patient does not have diabetes.  Continue replete potassium, continue sodium bicarb, also give phosphate.  Severe protein calorie malnutrition. Chronic diarrhea. Hypoglycemia. Spoke with patient's mother, patient  has been back to the family a few months back, she has been sick all the time.  She has been admitted to the hospital in [facility], stayed for 9 days, could not identify any source of her chronic illness. She clearly has significant malnutrition, but she has excellent appetite.  I will check thyroid function.  Due to hypoglycemia, I will check adrenal function. Patient may also has chronic diarrhea, I will try Creon  for now.  Patient need to refer to GI for colonoscopy to rule out Crohn disease.  Major depression. Per patient's mother, patient has significant depression, was placed on antidepressants for a while, she stopped the medicine as she is not able to afford the medicine.  I will restart a lower dose of Celexa  for now.  Leukocytosis. Much improved today, appears to be secondary to seizure.  Conjunctivitis. Patient complaints about right eye with some pain.  MRI of orbital did not show any abnormality.  I will start eyedrop with Cipro .      Subjective:  Patient has significant depression, crying. No abdominal pain.  Complaining some diarrhea without nausea vomiting.  Physical Exam: Vitals:   11/28/24 0400 11/28/24 0720 11/28/24 0800 11/28/24 1214  BP: 102/66 113/86 100/63 (!) 111/90  Pulse: 76 93 100 (!) 102  Resp: 18 15 17 18   Temp:  97.9 F (36.6 C)  98.6 F (37 C)  TempSrc:  Oral  Oral  SpO2: 100% 100% 100% 100%  Weight:      Height:       General exam: Appears calm and comfortable, appears severely malnourished. Respiratory system: Clear to auscultation. Respiratory effort normal. Cardiovascular system: S1 & S2 heard, RRR. No JVD, murmurs, rubs, gallops or  clicks. No pedal edema. Gastrointestinal system: Abdomen is nondistended, soft and nontender. No organomegaly or masses felt. Normal bowel sounds heard. Central nervous system: Alert and oriented. No focal neurological deficits. Extremities: Symmetric 5 x 5 power. Skin: No rashes, lesions or ulcers Psychiatry:  Judgement and insight appear normal. Mood & affect appropriate.    Data Reviewed:  Reviewed MRI results and images again, reviewed lab results  Family Communication: Mother updated.  Disposition: Status is: Inpatient Remains inpatient appropriate because: Severity of disease.     Time spent: 55 minutes  Author: Murvin Mana, MD 11/28/2024 12:36 PM  For on call review [email or web address].    "

## 2024-11-28 NOTE — Progress Notes (Signed)
 Notified by the pt. that they felt like they were having a seizure. This nurse arrived in the room to assess the pt. The pt. noted to be awake with slow verbal responses. However, they were displaying uncontrolled movement of  the head and chest that lasted approximately 20 sec. The pt. did not loose conscious during this episode. Airway maintained. 1 mg Ativan  administered. Vitals obtained. Safety measures in place.

## 2024-11-28 NOTE — ED Notes (Signed)
 Pt called RN in for concern for having a seizure. Pt was talking to this RN about the seizure and tracking RN around the room while claiming they were having a seizure.

## 2024-11-29 DIAGNOSIS — G40909 Epilepsy, unspecified, not intractable, without status epilepticus: Principal | ICD-10-CM

## 2024-11-29 DIAGNOSIS — E43 Unspecified severe protein-calorie malnutrition: Secondary | ICD-10-CM

## 2024-11-29 LAB — BASIC METABOLIC PANEL WITH GFR
Anion gap: 14 (ref 5–15)
BUN: 13 mg/dL (ref 6–20)
CO2: 23 mmol/L (ref 22–32)
Calcium: 9.8 mg/dL (ref 8.9–10.3)
Chloride: 104 mmol/L (ref 98–111)
Creatinine, Ser: 0.72 mg/dL (ref 0.44–1.00)
GFR, Estimated: >60 mL/min
Glucose, Bld: 58 mg/dL — ABNORMAL LOW (ref 70–99)
Potassium: 3.7 mmol/L (ref 3.5–5.1)
Sodium: 141 mmol/L (ref 135–145)

## 2024-11-29 LAB — CBG MONITORING, ED: Glucose-Capillary: 89 mg/dL (ref 70–99)

## 2024-11-29 LAB — PHOSPHORUS: Phosphorus: 4.1 mg/dL (ref 2.5–4.6)

## 2024-11-29 LAB — MAGNESIUM: Magnesium: 2.1 mg/dL (ref 1.7–2.4)

## 2024-11-29 LAB — CORTISOL-AM, BLOOD: Cortisol - AM: 14.8 ug/dL (ref 6.7–22.6)

## 2024-11-29 LAB — TSH: TSH: 0.755 u[IU]/mL (ref 0.350–4.500)

## 2024-11-29 LAB — GLUCOSE, CAPILLARY: Glucose-Capillary: 111 mg/dL — ABNORMAL HIGH (ref 70–99)

## 2024-11-29 MED ORDER — NICOTINE 14 MG/24HR TD PT24
14.0000 mg | MEDICATED_PATCH | Freq: Once | TRANSDERMAL | Status: AC
  Administered 2024-11-29: 14 mg via TRANSDERMAL
  Filled 2024-11-29: qty 1

## 2024-11-29 MED ORDER — DEXTROSE IN LACTATED RINGERS 5 % IV SOLN: INTRAVENOUS | Status: DC

## 2024-11-29 NOTE — ED Notes (Signed)
 Pt rang the call bell. Pt told me she was having bad abdominal pain and she couldn't feel her right leg. I told her I would inform the nurse. Rn was notified by this tech.

## 2024-11-29 NOTE — ED Notes (Signed)
 Pt states they were having trouble breathing, SpO2 100%. Pt placed on 2L [place] for comfort.

## 2024-11-29 NOTE — ED Notes (Addendum)
 Pt states they are feeling anxious and would like something for anxiety. Pt's HR is 115 and is restless on assessment.

## 2024-11-29 NOTE — Progress Notes (Addendum)
 " Progress Note   Patient: Patricia Guerra FMW:[id number] DOB: [birth year] DOA: 11/26/2024     2 DOS: the patient was seen and examined on 11/29/2024   Brief hospital course: Patricia Guerra is a 34 y.o. 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  Patient had a witnessed seizure lasting 30 minutes observed by EMS. Upon arriving to hospital, she was loaded with Keppra .  Seen by neurology.  MRI of the brain did not show any acute changes, EEG did not show any seizure activity.   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 Seizure disorder. Patient has not been taking her seizure medicine for few months, which had resulted in her seizure activity.  Per neurology, patient restarted on Keppra . Discussed with Dr. Merrianne, patient still has some disorientation and memory defect, prefer to keep in the hospital for another day. Patient does not have an any urinary symptoms, UA was negative.  No evidence of UTI, I will discontinue antibiotics. Urine culture negative. Advised patient to not drive until cleared by neurology. Patient does not have a recurrence of seizure, change Keppra  to oral. No additional seizure activity.   Metabolic acidosis secondary to seizure. Hypokalemia. Hypophosphatemia. Patient has some ketosis from seizure, continued sodium bicarb.  Patient does not have diabetes.  Continue replete potassium, continue sodium bicarb, also give phosphate. Condition  all improved.   Severe protein calorie malnutrition. Chronic diarrhea. Hypoglycemia. Spoke with patient's mother, patient has been back to the family a few months back, she has been sick all the time.  She has been admitted to the hospital in [facility], stayed for 9 days, could not identify any source of her chronic illness. She clearly has significant malnutrition, but she has excellent appetite.  She also has a chronic diarrhea.  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  Start Creon .  I will also send celiac disease panel.  She does seem to have significant hypoglycemia, cortisol level checked is pending.  I will put on D5 infusion for now for persistent hypoglycemia.  Also check C-peptide and insulin  level.    Major depression. Per patient's mother, patient has significant depression, was placed on antidepressants for a while, she stopped the medicine as she is not able to afford the medicine.  I will restart a lower dose of Celexa  for now.   Leukocytosis. Much improved today, appears to be secondary to seizure.   Conjunctivitis. Patient complaints about right eye with some pain.  MRI of orbital did not show any abnormality.  Cipro  drops, feels better..            Subjective:  She is still has significant watery stools today, no nausea vomiting.  Glucose still low.  Physical Exam: Vitals:   11/28/24 2300 11/29/24 0000 11/29/24 0324 11/29/24 0738  BP: 108/65 102/66 112/69 115/77  Pulse: 71 78 67 100  Resp: 14 17 19 19   Temp:   98 F (36.7 C) 97.7 F (36.5 C)  TempSrc:   Oral Oral  SpO2: 100% 100% 100% 100%  Weight:  Height:       General exam: Appears calm and comfortable, severely malnourished Respiratory system: Clear to auscultation. Respiratory effort normal. Cardiovascular system: S1 & S2 heard, RRR. No JVD, murmurs, rubs, gallops or clicks. No pedal edema. Gastrointestinal system: Abdomen is nondistended, soft and nontender. No organomegaly or masses  felt. Normal bowel sounds heard. Central nervous system: Alert and oriented. No focal neurological deficits. Extremities: Symmetric 5 x 5 power. Skin: No rashes, lesions or ulcers Psychiatry: Judgement and insight appear normal. Mood & affect appropriate.    Data Reviewed:  Lab results reviewed.  Family Communication:.Mother updated at bedside  Disposition: Status is: Inpatient Remains inpatient appropriate because: Severity of disease, IV fluids.     Time spent: 35 minutes  Author: Murvin Mana, MD 11/29/2024 10:16 AM  For on call review [email or web address].    "

## 2024-11-30 ENCOUNTER — Other Ambulatory Visit: Payer: Self-pay

## 2024-11-30 DIAGNOSIS — K529 Noninfective gastroenteritis and colitis, unspecified: Secondary | ICD-10-CM

## 2024-11-30 LAB — BASIC METABOLIC PANEL WITH GFR
Anion gap: 12 (ref 5–15)
BUN: 9 mg/dL (ref 6–20)
CO2: 25 mmol/L (ref 22–32)
Calcium: 9.3 mg/dL (ref 8.9–10.3)
Chloride: 107 mmol/L (ref 98–111)
Creatinine, Ser: 0.67 mg/dL (ref 0.44–1.00)
GFR, Estimated: >60 mL/min
Glucose, Bld: 115 mg/dL — ABNORMAL HIGH (ref 70–99)
Potassium: 3.4 mmol/L — ABNORMAL LOW (ref 3.5–5.1)
Sodium: 143 mmol/L (ref 135–145)

## 2024-11-30 LAB — GLUCOSE, CAPILLARY
Glucose-Capillary: 104 mg/dL — ABNORMAL HIGH (ref 70–99)
Glucose-Capillary: 90 mg/dL (ref 70–99)
Glucose-Capillary: 93 mg/dL (ref 70–99)
Glucose-Capillary: 122 mg/dL — ABNORMAL HIGH (ref 70–99)

## 2024-11-30 LAB — INSULIN AND C-PEPTIDE, SERUM
C-Peptide: 0.6 ng/mL — ABNORMAL LOW (ref 1.1–4.4)
Insulin: 1 u[IU]/mL — ABNORMAL LOW (ref 2.6–24.9)

## 2024-11-30 LAB — IRON AND TIBC
Iron: 77 ug/dL (ref 28–170)
Saturation Ratios: 30 % (ref 10.4–31.8)
TIBC: 258 ug/dL (ref 250–450)
UIBC: 180 ug/dL

## 2024-11-30 LAB — RETICULOCYTES
Immature Retic Fract: 7.7 % (ref 2.3–15.9)
RBC.: 4.17 MIL/uL (ref 3.87–5.11)
Retic Count, Absolute: 68.4 K/uL (ref 19.0–186.0)
Retic Ct Pct: 1.6 % (ref 0.4–3.1)

## 2024-11-30 LAB — FOLATE: Folate: 12.3 ng/mL

## 2024-11-30 LAB — FERRITIN: Ferritin: 88 ng/mL (ref 11–307)

## 2024-11-30 LAB — VITAMIN B12: Vitamin B-12: 625 pg/mL (ref 180–914)

## 2024-11-30 MED ORDER — LOPERAMIDE HCL 2 MG PO CAPS
2.0000 mg | ORAL_CAPSULE | Freq: Two times a day (BID) | ORAL | Status: DC
  Filled 2024-11-30: qty 1

## 2024-11-30 MED ORDER — OXYCODONE HCL 5 MG PO TABS
5.0000 mg | ORAL_TABLET | Freq: Once | ORAL | Status: AC
  Administered 2024-11-30: 5 mg via ORAL
  Filled 2024-11-30: qty 1

## 2024-11-30 MED ORDER — INFLUENZA VIRUS VACC SPLIT PF (FLUZONE) 0.5 ML IM SUSY
0.5000 mL | PREFILLED_SYRINGE | INTRAMUSCULAR | Status: AC
  Administered 2024-12-01: 0.5 mL via INTRAMUSCULAR
  Filled 2024-11-30: qty 0.5

## 2024-11-30 MED ORDER — POTASSIUM CHLORIDE 20 MEQ PO PACK
40.0000 meq | PACK | ORAL | Status: AC
  Administered 2024-11-30 (×2): 40 meq via ORAL
  Filled 2024-11-30 (×2): qty 2

## 2024-11-30 MED ORDER — DICYCLOMINE HCL 10 MG PO CAPS
10.0000 mg | ORAL_CAPSULE | Freq: Three times a day (TID) | ORAL | Status: DC
  Administered 2024-11-30 – 2024-12-01 (×4): 10 mg via ORAL
  Filled 2024-11-30 (×4): qty 1

## 2024-11-30 NOTE — Progress Notes (Signed)
 " Progress Note   Patient: Patricia Guerra FMW:[id number] DOB: Apr 16, 1991 DOA: 11/26/2024     3 DOS: the patient was seen and examined on 11/30/2024   Brief hospital course: Laquesha Jericho Cieslik is a 34 y.o. 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  Patient had a witnessed seizure lasting 30 minutes observed by EMS. Upon arriving to hospital, she was loaded with Keppra .  Seen by neurology.  MRI of the brain did not show any acute changes, EEG did not show any seizure activity. However, patient has having chronically illness, she has chronic diarrhea, she has severe malnutrition.  She has persistent hypoglycemia.  D5 is started.  TSH and cortisol level are normal.   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 Seizure disorder. Patient has not been taking her seizure medicine for few months, which had resulted in her seizure activity.  Per neurology, patient restarted on Keppra . Discussed with Dr. Merrianne, patient still has some disorientation and memory defect, prefer to keep in the hospital for another day. Patient does not have an any urinary symptoms, UA was negative.  No evidence of UTI, I will discontinue antibiotics. Urine culture negative. Advised patient to not drive until cleared by neurology. Patient does not have a recurrence of seizure, change Keppra  to oral. No additional seizure activity.   Metabolic acidosis secondary to  seizure. Hypokalemia. Hypophosphatemia. Patient has some ketosis from seizure, continued sodium bicarb.  Patient does not have diabetes.  Discontinue sodium bicarb.  Continue replete potassium, with added potassium to the IV fluids.   Severe protein calorie malnutrition. Chronic diarrhea. Hypoglycemia. Spoke with patient's mother, patient has been back to the family a few months back, she has been sick all the time.  She has been admitted to the hospital in [facility], stayed for 9 days, could not identify any source of her chronic illness. She clearly has significant malnutrition, but she has excellent appetite.  She also has a chronic diarrhea.  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  Start Creon .  I will also start Imodium  twice a day and add Bentyl  for abdominal cramping pain. Patient also has normal TSH and cortisol level. I also checked insulin  level and C-peptide, both are low, this is inconsistent with hypoglycemia.  Does not seem to have an insulinoma. Patient condition appeared to be from malabsorption, celiac disease panel sent out, also pending B1, B12 and folic acid level. Patient was started on D5 water, will continue for 12 hours.  Hopefully with decreased stool output after above treatment patient glucose can be better. May consider treated with hydrocortisone if glucose does not getting better with above treatment. Will also send stool study for GI panel.      Major depression. Per patient's mother, patient has significant depression, was placed on antidepressants for a while, she stopped the medicine as she is not able to afford the medicine.  Started lower dose of Celexa  for now.   Leukocytosis. Much improved, appears to be secondary to seizure. Culture from blood and urine all negative.  Conjunctivitis. Patient complaints about right eye with some pain.  MRI of orbital did not show any abnormality.  Cipro  drops, feels better..          Subjective:   Patient still complaining significant diarrhea, watery.  Some abdominal cramping.  No nausea vomiting.  Physical Exam: Vitals:   11/29/24 1951 11/29/24 2219 11/30/24 0450 11/30/24 0820  BP: 112/80 (!) 134/92 110/78 102/82  Pulse: 86 81 88 79  Resp: 16 19 14 18   Temp: (!) 97.5 F (36.4 C) 98.7 F (37.1 C) 98 F (36.7 C)   TempSrc: Oral Oral Oral   SpO2: 100% 100% 99% 97%  Weight:      Height:       General exam: Appears calm and comfortable, very malnourished. Respiratory system: Clear to auscultation. Respiratory effort normal. Cardiovascular system: S1 & S2 heard, RRR. No JVD, murmurs, rubs, gallops or clicks. No pedal edema. Gastrointestinal system: Abdomen is nondistended, soft and nontender. No organomegaly or masses felt. Normal bowel sounds heard. Central nervous system: Alert and oriented. No focal neurological deficits. Extremities: Symmetric 5 x 5 power. Skin: No rashes, lesions or ulcers Psychiatry: Judgement and insight appear normal. Mood & affect appropriate.    Data Reviewed:  Labs reviewed.  Family Communication: None  Disposition: Status is: Inpatient Remains inpatient appropriate because: Severity of disease, IV treatment.     Time spent: 55 minutes  Author: Murvin Mana, MD 11/30/2024 9:35 AM  For on call review [email or web address].    "

## 2024-11-30 NOTE — TOC CM/SW Note (Signed)
 Transition of Care [facility]) - Inpatient Brief Assessment   Patient Details  Name: Patricia Guerra MRN: [id number] Date of Birth: 07/22/1991  Transition of Care [facility]) CM/SW Contact:    Daved JONETTA Hamilton, RN Phone Number: 11/30/2024, 12:59 PM   Clinical Narrative:   Transition of Care [facility]) Screening Note   Patient Details  Name: Patricia Guerra Date of Birth: 1991-10-26   Transition of Care [facility]) CM/SW Contact:    Daved JONETTA Hamilton, RN Phone Number: 11/30/2024, 12:59 PM  Resources added to AVS for food, social, and PCP.  Transition of Care Department [facility]) has reviewed patient and no TOC needs have been identified at this time. If new patient transition needs arise, please place a TOC consult.    Transition of Care Asessment: Insurance and Status: Insurance coverage has been reviewed Patient has primary care physician: No (Resources added to AVS) Home environment has been reviewed: lives with mom Prior level of function:: independent Prior/Current Home Services: No current home services Social Drivers of Health Review: SDOH reviewed interventions complete (Resources added to AVS for Food, Social, and PCP) Readmission risk has been reviewed: Yes Transition of care needs: no transition of care needs at this time

## 2024-11-30 NOTE — Progress Notes (Signed)
 Patient complaining of pain and tingling sensation in right arm and leg.  Anemia workup added

## 2024-11-30 NOTE — Plan of Care (Signed)
   Problem: Education: Goal: Knowledge of General Education information will improve Description: Including pain rating scale, medication(s)/side effects and non-pharmacologic comfort measures Outcome: Progressing   Problem: Activity: Goal: Risk for activity intolerance will decrease Outcome: Progressing   Problem: Nutrition: Goal: Adequate nutrition will be maintained Outcome: Progressing
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
## 2024-11-30 NOTE — Plan of Care (Signed)
   Problem: Education: Goal: Knowledge of General Education information will improve Description Including pain rating scale, medication(s)/side effects and non-pharmacologic comfort measures Outcome: Progressing

## 2024-11-30 NOTE — Plan of Care (Signed)

## 2024-12-01 ENCOUNTER — Other Ambulatory Visit: Payer: Self-pay

## 2024-12-01 DIAGNOSIS — Z789 Other specified health status: Secondary | ICD-10-CM

## 2024-12-01 DIAGNOSIS — E878 Other disorders of electrolyte and fluid balance, not elsewhere classified: Secondary | ICD-10-CM

## 2024-12-01 DIAGNOSIS — E162 Hypoglycemia, unspecified: Secondary | ICD-10-CM

## 2024-12-01 DIAGNOSIS — M79601 Pain in right arm: Secondary | ICD-10-CM

## 2024-12-01 LAB — PHOSPHORUS: Phosphorus: 5.4 mg/dL — ABNORMAL HIGH (ref 2.5–4.6)

## 2024-12-01 LAB — BASIC METABOLIC PANEL WITH GFR
Anion gap: 11 (ref 5–15)
BUN: 9 mg/dL (ref 6–20)
CO2: 26 mmol/L (ref 22–32)
Calcium: 9.2 mg/dL (ref 8.9–10.3)
Chloride: 104 mmol/L (ref 98–111)
Creatinine, Ser: 0.59 mg/dL (ref 0.44–1.00)
GFR, Estimated: >60 mL/min
Glucose, Bld: 98 mg/dL (ref 70–99)
Potassium: 3.7 mmol/L (ref 3.5–5.1)
Sodium: 140 mmol/L (ref 135–145)

## 2024-12-01 LAB — CULTURE, BLOOD (ROUTINE X 2)
Culture: NO GROWTH
Culture: NO GROWTH
Special Requests: ADEQUATE
Special Requests: ADEQUATE

## 2024-12-01 LAB — MAGNESIUM: Magnesium: 1.9 mg/dL (ref 1.7–2.4)

## 2024-12-01 LAB — CELIAC DISEASE PANEL
Endomysial Ab, IgA: NEGATIVE
IgA: 155 mg/dL (ref 87–352)
Tissue Transglutaminase Ab, IgA: <2 U/mL (ref 0–3)

## 2024-12-01 MED ORDER — OLANZAPINE 5 MG PO TABS
5.0000 mg | ORAL_TABLET | Freq: Every day | ORAL | 0 refills | Status: AC
  Filled 2024-12-01: qty 30, 30d supply, fill #0

## 2024-12-01 MED ORDER — LEVETIRACETAM 500 MG PO TABS
500.0000 mg | ORAL_TABLET | Freq: Two times a day (BID) | ORAL | 0 refills | Status: AC
  Filled 2024-12-01: qty 60, 30d supply, fill #0

## 2024-12-01 MED ORDER — HYDROXYZINE HCL 25 MG PO TABS
25.0000 mg | ORAL_TABLET | Freq: Two times a day (BID) | ORAL | 0 refills | Status: AC | PRN
  Filled 2024-12-01: qty 30, 15d supply, fill #0

## 2024-12-01 MED ORDER — DICYCLOMINE HCL 10 MG PO CAPS
10.0000 mg | ORAL_CAPSULE | Freq: Three times a day (TID) | ORAL | 0 refills | Status: AC
  Filled 2024-12-01: qty 90, 23d supply, fill #0

## 2024-12-01 MED ORDER — CIPROFLOXACIN HCL 0.3 % OP SOLN
1.0000 [drp] | OPHTHALMIC | 0 refills | Status: AC
  Filled 2024-12-01: qty 10, 30d supply, fill #0

## 2024-12-01 MED ORDER — CITALOPRAM HYDROBROMIDE 10 MG PO TABS
10.0000 mg | ORAL_TABLET | Freq: Every day | ORAL | 0 refills | Status: AC
  Filled 2024-12-01: qty 30, 30d supply, fill #0

## 2024-12-01 NOTE — Assessment & Plan Note (Signed)
 Continue eating.  Continue supplements

## 2024-12-01 NOTE — Assessment & Plan Note (Signed)
 Advised cessation

## 2024-12-01 NOTE — Assessment & Plan Note (Signed)
 Follow up as outpatient

## 2024-12-01 NOTE — Assessment & Plan Note (Signed)
 Seems more musculoskeletal.

## 2024-12-01 NOTE — Discharge Summary (Signed)
 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 [facility] Course: Patricia Guerra is a 34 y.o. 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  Patient had a witnessed seizure lasting 30 minutes observed by EMS. The patient was loaded with Keppra .  Seen by neurology.  MRI of the brain did not show any acute changes, EEG did not show any seizure activity. However, patient has having chronically illness, has chronic diarrhea,  has severe malnutrition.  She has persistent hypoglycemia.  TSH and cortisol level are normal.  C-peptide and insulin  levels are low which goes along with chronic malnutrition.  1/14.  Patient wants to go home today.  Patient discharged home.  [facility]  Assessment and Plan: * Seizure disorder (HCC) Loaded with Keppra  and prescribed Keppra  upon going home.  No driving for 6 months until cleared by neurology to go back to driving.  Electrolyte abnormality Hypokalemia and hypophosphatemia.  Both replaced.  Right arm pain Seems more musculoskeletal.  Hypoglycemia Secondary to poor appetite  Protein-calorie malnutrition, severe Continue  eating.  Continue supplements  Tetrahydrocannabinol (THC) use disorder, mild, in early remission, abuse Advised cessation  Anxiety and depression Started on Celexa   Transgender Follow-up as outpatient   Sepsis ruled out      Consultants: Neurology Procedures performed: None Disposition: Home Diet recommendation:  Regular diet DISCHARGE MEDICATION: Allergies as of 12/01/2024       Reactions   Penicillins Anaphylaxis, Swelling   Has patient had a PCN reaction causing immediate rash, facial/tongue/throat swelling, SOB or lightheadedness with hypotension: Yes Has patient had a PCN reaction causing severe rash involving mucus membranes or skin necrosis: No Has patient had a PCN reaction that required hospitalization: Yes, was already in the hospital Has patient had a PCN reaction occurring within the last 10 years: No If all of the above answers are NO, then may proceed with Cephalosporin use.        Medication List     STOP taking these medications    clonazePAM 0.5 MG tablet Commonly known as: KLONOPIN   ibuprofen  200 MG tablet Commonly known as: ADVIL        TAKE these medications    ciprofloxacin  0.3 % ophthalmic solution Commonly known as: CILOXAN  Place 1 drop into both eyes every 4 (four) hours while awake. Administer 1 drop, every 2 hours, while awake, for 2 days. Then 1 drop, every 4 hours, while awake, for the next 5 days.   citalopram  10 MG tablet Commonly known as: CELEXA  Take 1 tablet (10 mg total) by mouth daily.   dicyclomine  10 MG capsule Commonly known as: BENTYL  Take 1 capsule (10 mg total) by mouth 4 (four) times daily -  before meals and at bedtime.  hydrOXYzine  25 MG tablet Commonly known as: ATARAX  Take 1 tablet (25 mg total) by mouth 2 (two) times daily as needed for anxiety.   levETIRAcetam  500 MG tablet Commonly known as: Keppra  Take 1 tablet (500 mg total) by mouth 2 (two) times daily. What changed: Another medication with  the same name was removed. Continue taking this medication, and follow the directions you see here.   OLANZapine  5 MG tablet Commonly known as: ZYPREXA  Take 1 tablet (5 mg total) by mouth at bedtime. What changed:  medication strength when to take this        Follow-up Information     [facility] Follow up in 2 week(s).   Specialty: Primary Care Contact information: [address] [place]  [address] [phone]        [facility] Follow up in 3 month(s).   Contact information: [address]     [address] [place]  [address] [phone]               Discharge Exam: Fredricka Weights   11/26/24 1119  Weight: 41.7 kg   Physical Exam HENT:     Head: Normocephalic.  Cardiovascular:     Rate and Rhythm: Normal rate and regular rhythm.     Heart sounds: Normal heart sounds, S1 normal and S2 normal.  Pulmonary:     Breath sounds: No decreased breath sounds, wheezing, rhonchi or rales.  Abdominal:     Palpations: Abdomen is soft.     Tenderness: There is no abdominal tenderness.  Musculoskeletal:     Right lower leg: No swelling.     Left lower leg: No swelling.     Comments: Good range of motion right arm and also right leg.  Skin:    General: Skin is warm.     Findings: No rash.  Neurological:     Mental Status: He is alert.      Condition at discharge: stable  The results of significant diagnostics from this hospitalization (including imaging, microbiology, ancillary and laboratory) are listed below for reference.   Imaging Studies: EEG adult Result Date: 11/27/2024 Shelton Arlin KIDD, MD     11/27/2024  8:31 AM Patient Name: Patricia Guerra MRN: [id number] Epilepsy Attending: Arlin KIDD Shelton Referring Physician/Provider: Seena Marsa NOVAK, MD Date: 11/26/2024 Duration: 25.57 mins Patient history: 34yo F with ams. EEG to evaluate for seizure Level of alertness: comatose/  lethargic AEDs during EEG study: LEV Technical aspects: This EEG study was done with scalp electrodes positioned according to the 10-20 International system of electrode placement. Electrical activity was reviewed with band pass filter of 1-70Hz , sensitivity of 7 uV/mm, display speed of 72mm/sec with a 60Hz  notched filter applied as appropriate. EEG data were recorded continuously and digitally stored.  Video monitoring was available and reviewed as appropriate. Description: EEG showed continuous generalized 3 to 6 Hz theta-delta slowing admixed with 15 to 18 Hz beta activity distributed symmetrically and diffusely. Hyperventilation and photic stimulation were not performed.   ABNORMALITY - Continuous slow, generalized IMPRESSION: This study is suggestive of generalized cerebral dysfunction (encephalopathy). No seizures or epileptiform discharges were seen throughout the recording. Arlin KIDD Shelton   MR BRAIN WO CONTRAST Result Date: 11/26/2024 EXAM: MRI BRAIN WITHOUT CONTRAST 11/26/2024 07:34:00 PM TECHNIQUE: Multiplanar multisequence MRI of the head/brain was performed without the administration of intravenous contrast. COMPARISON: None available. CLINICAL HISTORY: Neuro deficit, acute, stroke suspected. FINDINGS: BRAIN AND VENTRICLES: No acute infarct.  No intracranial hemorrhage. No mass. No midline shift. No hydrocephalus. The sella is unremarkable. Normal flow voids. ORBITS: No significant abnormality. SINUSES AND MASTOIDS: No significant abnormality. BONES AND SOFT TISSUES: Normal marrow signal. No significant soft tissue abnormality. IMPRESSION: 1. No acute findings. Electronically signed by: Franky Stanford MD MD 11/26/2024 08:00 PM EST RP Workstation: [id number]   DG Chest Portable 1 View Result Date: 11/26/2024 CLINICAL DATA:  Seizures. EXAM: PORTABLE CHEST 1 VIEW COMPARISON:  September 10, 2024 FINDINGS: The heart size and mediastinal contours are within normal limits. No acute infiltrate, pleural effusion  or pneumothorax is identified. The visualized skeletal structures are unremarkable. IMPRESSION: No active disease. Electronically Signed   By: Suzen Dials M.D.   On: 11/26/2024 12:25   CT HEAD WO CONTRAST ( ) Result Date: 11/26/2024 EXAM: CT HEAD WITHOUT CONTRAST 11/26/2024 11:58:24 AM TECHNIQUE: CT of the head was performed without the administration of intravenous contrast. Automated exposure control, iterative reconstruction, and/or weight based adjustment of the mA/kV was utilized to reduce the radiation dose to as low as reasonably achievable. COMPARISON: Head CT 09/10/2024. CLINICAL HISTORY: AMS, seizure. FINDINGS: LIMITATIONS/ARTIFACTS: Exam is limited by suboptimal angulation and subsequent artifact. BRAIN AND VENTRICLES: No definite acute hemorrhage. No evidence of acute territorial infarct. No hydrocephalus. No large extra-axial collection. No large mass effect or midline shift. ORBITS: No acute abnormality. SINUSES: No acute abnormality. SOFT TISSUES AND SKULL: No acute soft tissue abnormality. No calvarial fracture. Left mandibular coronoid process tip minimally displaced fracture is redemonstrated. IMPRESSION: 1. Examination is limited by suboptimal angulation and subsequent artifact. 2. Within this limitation, no acute intracranial hemorrhage or mass effect is seen. 3. No calvarial fracture. Electronically signed by: Prentice Spade MD 11/26/2024 12:24 PM EST RP Workstation: [id number]    Microbiology: Results for orders placed or performed during the hospital encounter of 11/26/24  Urine Culture     Status: None   Collection Time: 11/26/24 11:24 AM   Specimen: Urine, Clean Catch  Result Value Ref Range Status   Specimen Description   Final    URINE, CLEAN CATCH Performed at [facility], [address] [place], [place] [address]    Special Requests   Final    NONE Performed at [facility], [address], [place], [place] [address]    Culture   Final     NO GROWTH Performed at [facility], [address], [place], [place] [address]    Report Status 11/28/2024 FINAL  Final  Blood Culture (routine x 2)     Status: None   Collection Time: 11/26/24  1:37 PM   Specimen: BLOOD  Result Value Ref Range Status   Specimen Description BLOOD LEFT ANTECUBITAL  Final   Special Requests   Final    BOTTLES DRAWN AEROBIC AND ANAEROBIC Blood Culture adequate volume   Culture   Final    NO GROWTH 5 DAYS Performed at [facility], [address], [place], [place] [address]    Report Status 12/01/2024 FINAL  Final  Blood Culture (routine x 2)     Status: None   Collection Time: 11/26/24  1:37 PM   Specimen: BLOOD  Result Value Ref Range Status   Specimen Description BLOOD RIGHT ANTECUBITAL  Final   Special Requests   Final    BOTTLES DRAWN AEROBIC AND ANAEROBIC Blood Culture adequate volume   Culture   Final    NO GROWTH 5 DAYS Performed at [facility], [address] [place],  [place] [address]    Report Status 12/01/2024 FINAL  Final    Labs: CBC: Recent Labs  Lab 11/26/24 1110 11/27/24 0824  WBC 22.2* 11.4*  NEUTROABS 20.0*  --   HGB 13.7 11.9*  HCT 41.8 35.3*  MCV 92.3 89.6  PLT 361 269   Basic Metabolic Panel: Recent Labs  Lab 11/27/24 0824 11/28/24 0755 11/29/24 0731 11/30/24 0600 12/01/24 0356  NA 138 141 141 143 140  K 3.4* 3.4* 3.7 3.4* 3.7  CL 105 103 104 107 104  CO2 19* 21* 23 25 26   GLUCOSE 75 65* 58* 115* 98  BUN 10 10 13 9 9   CREATININE 0.57 0.65 0.72 0.67 0.59  CALCIUM 9.0 9.3 9.8 9.3 9.2  MG  --  2.1 2.1  --  1.9  PHOS  --  2.3* 4.1  --  5.4*   Liver Function Tests: Recent Labs  Lab 11/26/24 1110 11/26/24 1728 11/27/24 0824  AST 19 12* 15  ALT 12 7 9   ALKPHOS 55 41 43  BILITOT 0.6 0.6 0.9  PROT 8.2* 6.3* 6.5  ALBUMIN 5.1* 4.1 4.1   CBG: Recent Labs  Lab 11/29/24 2214 11/30/24 0821 11/30/24 1220 11/30/24 1637 11/30/24 2137  GLUCAP 111* 104* 90 93 122*     Discharge time spent: greater than 30 minutes.  Signed: Charlie Patterson, MD [facility] 12/01/2024 "

## 2024-12-01 NOTE — Assessment & Plan Note (Signed)
 Started on Celexa 

## 2024-12-01 NOTE — Assessment & Plan Note (Addendum)
 Loaded with Keppra  and prescribed Keppra  upon going home.  No driving for 6 months until cleared by neurology to go back to driving.  EEG showed generalized cerebral dysfunction without seizures or epileptiform discharges.  MRI of the brain was negative.

## 2024-12-01 NOTE — Assessment & Plan Note (Signed)
 Hypokalemia and hypophosphatemia.  Both replaced.

## 2024-12-01 NOTE — Assessment & Plan Note (Signed)
 Secondary to poor appetite

## 2024-12-03 LAB — VITAMIN B1: Vitamin B1 (Thiamine): 94.3 nmol/L (ref 66.5–200.0)
